# Patient Record
Sex: Male | Born: 1970 | Race: White | Hispanic: No | Marital: Married | Smoking: Never smoker
Health system: Southern US, Community
[De-identification: ages and names within clinical notes are randomized; demographics above are authoritative.]

## PROBLEM LIST (undated history)

## (undated) DIAGNOSIS — K219 Gastro-esophageal reflux disease without esophagitis: Secondary | ICD-10-CM

## (undated) HISTORY — PX: COLONOSCOPY: SHX174

## (undated) HISTORY — PX: SHOULDER SURGERY: SHX246

## (undated) HISTORY — PX: TONSILLECTOMY: SUR1361

## (undated) HISTORY — PX: UPPER GASTROINTESTINAL ENDOSCOPY: SHX188

## (undated) HISTORY — DX: Gastro-esophageal reflux disease without esophagitis: K21.9

---

## 2000-04-18 ENCOUNTER — Encounter: Payer: Self-pay | Admitting: Internal Medicine

## 2000-05-30 ENCOUNTER — Encounter: Payer: Self-pay | Admitting: Internal Medicine

## 2000-11-11 ENCOUNTER — Emergency Department (HOSPITAL_COMMUNITY): Admission: EM | Admit: 2000-11-11 | Discharge: 2000-11-11 | Payer: Self-pay | Admitting: Emergency Medicine

## 2000-11-11 ENCOUNTER — Encounter: Payer: Self-pay | Admitting: Emergency Medicine

## 2002-01-04 ENCOUNTER — Encounter: Payer: Self-pay | Admitting: Internal Medicine

## 2003-05-29 ENCOUNTER — Encounter: Payer: Self-pay | Admitting: Internal Medicine

## 2005-08-09 ENCOUNTER — Ambulatory Visit: Payer: Self-pay | Admitting: Internal Medicine

## 2006-10-25 ENCOUNTER — Ambulatory Visit: Payer: Self-pay | Admitting: Internal Medicine

## 2007-09-01 DIAGNOSIS — Z8719 Personal history of other diseases of the digestive system: Secondary | ICD-10-CM | POA: Insufficient documentation

## 2007-09-01 DIAGNOSIS — K219 Gastro-esophageal reflux disease without esophagitis: Secondary | ICD-10-CM | POA: Insufficient documentation

## 2008-03-17 ENCOUNTER — Ambulatory Visit: Payer: Self-pay | Admitting: Internal Medicine

## 2008-04-01 ENCOUNTER — Ambulatory Visit (HOSPITAL_COMMUNITY): Admission: RE | Admit: 2008-04-01 | Discharge: 2008-04-01 | Payer: Self-pay | Admitting: Orthopaedic Surgery

## 2008-04-30 ENCOUNTER — Telehealth: Payer: Self-pay | Admitting: Internal Medicine

## 2008-05-13 ENCOUNTER — Telehealth: Payer: Self-pay | Admitting: Internal Medicine

## 2010-02-17 ENCOUNTER — Telehealth: Payer: Self-pay | Admitting: Internal Medicine

## 2010-02-24 ENCOUNTER — Ambulatory Visit: Payer: Self-pay | Admitting: Internal Medicine

## 2010-02-24 DIAGNOSIS — F329 Major depressive disorder, single episode, unspecified: Secondary | ICD-10-CM | POA: Insufficient documentation

## 2010-06-08 NOTE — Progress Notes (Signed)
Summary: [facility]   Imported By: Sherian Rein 02/24/2010 13:32:04  _____________________________________________________________________  External Attachment:    Type:   Image     Comment:   External Document

## 2010-06-08 NOTE — Consult Note (Signed)
Summary: [facility]   Imported By: Sherian Rein 02/24/2010 13:25:49  _____________________________________________________________________  External Attachment:    Type:   Image     Comment:   External Document

## 2010-06-08 NOTE — Progress Notes (Signed)
Summary: [facility]   Imported By: Sherian Rein 02/24/2010 13:30:13  _____________________________________________________________________  External Attachment:    Type:   Image     Comment:   External Document

## 2010-06-08 NOTE — Progress Notes (Signed)
Summary: [facility]   Imported By: Sherian Rein 02/24/2010 13:28:09  _____________________________________________________________________  External Attachment:    Type:   Image     Comment:   External Document

## 2010-06-08 NOTE — Progress Notes (Signed)
Summary: RX  Medications Added PROTONIX 40 MG TBEC (PANTOPRAZOLE SODIUM) 1 tablet by mouth once daily       Phone Note Call from Patient Call back at Home Phone [phone]   Caller: Patient Call For: perry  Reason for Call: Talk to Nurse Details for Reason: RX Summary of Call: would like Protonix  [facility]  [address]  [facility] Please call pt when done Initial call taken by: Guadlupe [facility],  April 30, 2008 12:36 PM    New/Updated Medications: PROTONIX 40 MG TBEC (PANTOPRAZOLE SODIUM) 1 tablet by mouth once daily   Prescriptions: PROTONIX 40 MG TBEC (PANTOPRAZOLE SODIUM) 1 tablet by mouth once daily  #30 x 11   Entered by:   Milford Cage CMA   Authorized by:   Hilarie Fredrickson MD   Signed by:   Milford Cage CMA on 04/30/2008   Method used:   [facility]  [address]  [id number]       [address]       [place], [place]  [address]       Ph: [phone] or [phone]       Fax: [phone]   RxID:   [id number]

## 2010-06-08 NOTE — Assessment & Plan Note (Signed)
Summary: yearly follow up/yf    History of Present Illness Visit Type: follow up Primary GI MD: Yancey Flemings MD Primary Provider: Sigmund Hazel, MD Chief Complaint: Follow-up visit-yearly History of Present Illness:   Jared Deleon presents today for routine followup. He is seen in his office and you late regarding management of gastroesophageal reflux disease. Symptoms have been controlled on Nexium 40 mg daily. Omeprazole 20 mg daily is not effective. He has rare breakthrough symptoms. No dysphagia. No medication side effects. No interval medical problems. He tells me that his prescription benefit for the year as expired. His looking for a more affordable alternative until the first of the year.             Prior Medications Reviewed Using: List Brought by Patient  Updated Prior Medication List: NEXIUM 40 MG  CPDR (ESOMEPRAZOLE MAGNESIUM) 1 capsule by mouth once daily ADDERALL XR 20 MG XR24H-CAP (AMPHETAMINE-DEXTROAMPHETAMINE) 1 tablet by mouth once daily ALEVE 220 MG TABS (NAPROXEN SODIUM) 1 tablet by mouth two times a day as needed  Current Allergies (reviewed today): ! PENICILLIN  Past Medical History:    Reviewed history from 09/01/2007 and no changes required:       Current Problems:        IRRITABLE BOWEL SYNDROME, HX OF (ICD-V12.79)       GERD (ICD-530.81)         Past Surgical History:    Tonsillectomy    Shoulder Surgery   Family History:    No FH of Colon Cancer:    Family History of Prostate Cancer:Paternal Grandfather diagnosed age: mid 80's  Social History:    Patient has never smoked.     Alcohol Use - yes    Daily Caffeine Use    Illicit Drug Use - no    Patient gets regular exercise.   Risk Factors:  Tobacco use:  never Drug use:  no Alcohol use:  yes Exercise:  yes   Review of Systems       entirely negative   Vital Signs:  Patient Profile:   40 Years Old Male Height:     69 inches Weight:      164 pounds Pulse rate:   88 / minute Pulse  rhythm:   regular BP sitting:   120 / 74  (left arm)  Vitals Entered By: Teresita Madura CMA (March 17, 2008 1:36 PM)                  Physical Exam  General:     Well developed, well nourished, no acute distress. Head:     Normocephalic and atraumatic. Eyes:     PERRLA, no icterus. Nose:     No deformity, discharge,  or lesions. Mouth:     No deformity or lesions, dentition normal. Lungs:     Clear throughout to auscultation. Heart:     Regular rate and rhythm; no murmurs, rubs,  or bruits. Abdomen:     Soft, nontender and nondistended. No masses, hepatosplenomegaly or hernias noted. Normal bowel sounds. Msk:     Symmetrical with no gross deformities. Normal posture. Pulses:     Normal pulses noted. Extremities:     No clubbing, cyanosis, edema or deformities noted. Neurologic:     Alert and  oriented x4;  grossly normal neurologically. Skin:     Intact without significant lesions or rashes. Psych:     Alert and cooperative. Normal mood and affect.    Impression & Recommendations:  Problem #  1:  GERD (ICD-530.81) Asymptomatic on Nexium. Issues regarding medication affordability as discussed.  Plan: #1. Samples of Protonix 40 mg daily have been provided. If this is effective, then the generic pantoprazole can be prescribed. #2. samples of Nexium also provided #3. Routine office followup in one year. Sooner for problems or questions   Patient Instructions: 1)  Samples of Nexium and Protonix given to patient. 2)  He is to call the office and inform us of which one works best. 3)  Rx. will then be phoned to his pharmacy.    ]

## 2010-06-08 NOTE — Progress Notes (Signed)
Summary: Medication refill  Medications Added NEXIUM 40 MG  CPDR (ESOMEPRAZOLE MAGNESIUM) 1 capsule by mouth once daily ADDERALL XR 20 MG XR24H-CAP (AMPHETAMINE-DEXTROAMPHETAMINE) 1 tablet by mouth once daily ALEVE 220 MG TABS (NAPROXEN SODIUM) 1 tablet by mouth two times a day as needed PROTONIX 40 MG TBEC (PANTOPRAZOLE SODIUM) 1 tablet by mouth once daily       Phone Note Call from Patient Call back at [facility] Phone [phone]   Caller: Patient Call For: Dr. Marina Goodell Reason for Call: Refill Medication Summary of Call: Needs a refill on Nexium....his appt. was bumped w/Dr. Marina Goodell and he has ran out Initial call taken by: Karna Christmas,  February 17, 2010 2:08 PM  Follow-up for Phone Call        refilled for #30 NRFs.  pt. notified. L/M on Voicemail.    Milford Cage [facility]  February 17, 2010 2:16 PM     Prescriptions: NEXIUM 40 MG  CPDR (ESOMEPRAZOLE MAGNESIUM) 1 capsule by mouth once daily  #30 Capsule x 0   Entered by:   Milford Cage NCMA   Authorized by:   Hilarie Fredrickson MD   Signed by:   Milford Cage NCMA on 02/17/2010   Method used:   [facility]        [facility] [address] [facility]       [address]       [place], [place]  [address]       Ph: [phone] or [phone]       Fax: [phone]   RxID:   [id number]

## 2010-06-08 NOTE — Progress Notes (Signed)
Summary: rx refill    Phone Note Call from Patient Call back at Home Phone [phone]   Caller: Patient Call For: perry Reason for Call: Refill Medication, Talk to Nurse Summary of Call: pt would like an rx called in (nexium) protonix does not work please cal it in to [facility] [address] [phone] Initial call taken by: Tawni Levy,  May 13, 2008 1:08 PM      Prescriptions: NEXIUM 40 MG  CPDR (ESOMEPRAZOLE MAGNESIUM) 1 capsule by mouth once daily  #30 x 6   Entered by:   Milford Cage CMA   Authorized by:   Hilarie Fredrickson MD   Signed by:   Milford Cage CMA on 05/13/2008   Method used:   [facility]  [address]  [facility]       [address]       [place], [place]  [address]       Ph: [phone] or [phone]       Fax: [phone]   RxID:   [id number]

## 2010-06-08 NOTE — Progress Notes (Signed)
Summary: [facility]   Imported By: Sherian Rein 02/24/2010 13:31:17  _____________________________________________________________________  External Attachment:    Type:   Image     Comment:   External Document

## 2010-06-08 NOTE — Assessment & Plan Note (Signed)
Summary: GERD. Med refill and routine followup    History of Present Illness Visit Type: Follow-up Visit Primary GI MD: Yancey Flemings MD Primary Provider: Sigmund Hazel, MD Requesting Provider: na Chief Complaint: F/u for GERD. Pt denies any GI complaints. Need refill on Nexium  History of Present Illness:   40 year old with a history of GERD without alarm features. Being managed adequate. He takes Nexium 40 mg daily. No prior upper endoscopy. He continues to report good control of symptoms on Nexium. No breakthrough symptoms. No dysphagia. No appreciable medication side effects. Has routine annual medical care with Dr. Hyacinth Meeker. Requests medication refill.   GI Review of Systems      Denies abdominal pain, acid reflux, belching, bloating, chest pain, dysphagia with liquids, dysphagia with solids, heartburn, loss of appetite, nausea, vomiting, vomiting blood, weight loss, and  weight gain.        Denies anal fissure, black tarry stools, change in bowel habit, constipation, diarrhea, diverticulosis, fecal incontinence, heme positive stool, hemorrhoids, irritable bowel syndrome, jaundice, light color stool, liver problems, rectal bleeding, and  rectal pain.    Current Medications (verified): 1)  Nexium 40 Mg  Cpdr (Esomeprazole Magnesium) .Marland Kitchen.. 1 Capsule By Mouth Once Daily 2)  Aleve 220 Mg Tabs (Naproxen Sodium) .Marland Kitchen.. 1 Tablet By Mouth Two Times A Day As Needed 3)  Wellbutrin Xl 300 Mg Xr24h-Tab (Bupropion Hcl) .... One Tablet By Mouth Once Daily  Allergies (verified): 1)  ! Penicillin  Past History:  Past Medical History: DEPRESSION (ICD-311) IRRITABLE BOWEL SYNDROME, HX OF (ICD-V12.79) GERD (ICD-530.81)  Past Surgical History: Reviewed history from 03/17/2008 and no changes required. Tonsillectomy Shoulder Surgery  Family History: Reviewed history from 03/17/2008 and no changes required. No FH of Colon Cancer: Family History of Prostate Cancer:Paternal Grandfather diagnosed age:  mid 78's  Social History: [employer]  Married Patient has never smoked.  Alcohol Use - yes Daily Caffeine Use Illicit Drug Use - no Patient gets regular exercise.  Review of Systems  The patient denies allergy/sinus, anemia, anxiety-new, arthritis/joint pain, back pain, blood in urine, breast changes/lumps, change in vision, confusion, cough, coughing up blood, depression-new, fainting, fatigue, fever, headaches-new, hearing problems, heart murmur, heart rhythm changes, itching, menstrual pain, muscle pains/cramps, night sweats, nosebleeds, pregnancy symptoms, shortness of breath, skin rash, sleeping problems, sore throat, swelling of feet/legs, swollen lymph glands, thirst - excessive , urination - excessive , urination changes/pain, urine leakage, vision changes, and voice change.    Vital Signs:  Patient profile:   40 year old male Height:      69 inches Weight:      172 pounds BMI:     25.49 BSA:     1.94 Pulse rate:   88 / minute Pulse rhythm:   regular BP sitting:   120 / 80  (left arm) Cuff size:   regular  Vitals Entered By: Ok Anis CMA (February 24, 2010 8:59 AM)  Physical Exam  General:  Well developed, well nourished, no acute distress. Head:  Normocephalic and atraumatic. Eyes:  PERRLA, no icterus. Ears:  Normal auditory acuity. Nose:  No deformity, discharge,  or lesions. Mouth:  No deformity or lesions, dentition normal. Neck:  Supple; no masses or thyromegaly. Lungs:  Clear throughout to auscultation. Heart:  Regular rate and rhythm; no murmurs, rubs,  or bruits. Abdomen:  Soft, nontender and nondistended. No masses, hepatosplenomegaly or hernias noted. Normal bowel sounds. Msk:  Symmetrical with no gross deformities. Normal posture. Pulses:  Normal pulses noted. Extremities:  no edema Neurologic:  alert and oriented Skin:  no jaundice Psych:  Alert and cooperative. Normal mood and affect.   Impression & Recommendations:  Problem # 1:  GERD  (ICD-530.81) chronic GERD. Asymptomatic on Nexium. No interval problems. We discussed the current state of GERD therapies.  Plan #1. Reflux precautions #2. Refill Nexium 40 mg daily #3. Routine followup in the office in 2 years. Sooner for interval questions or problems  Patient Instructions: 1)  Refilled Nexium 40 mg #30 x 11 RFs. 2)  Follow-up in 2 years. 3)  Copy sent to : Sigmund Hazel, MD 4)  The medication list was reviewed and reconciled.  All changed / newly prescribed medications were explained.  A complete medication list was provided to the patient / caregiver. Prescriptions: NEXIUM 40 MG  CPDR (ESOMEPRAZOLE MAGNESIUM) 1 capsule by mouth once daily  #30 Capsule x 11   Entered by:   Milford Cage NCMA   Authorized by:   Hilarie Fredrickson MD   Signed by:   Milford Cage NCMA on 02/24/2010   Method used:   [facility] to        [facility] [address] [facility]       [address]       [place], [place]  [address]       Ph: [phone] or [phone]       Fax: [phone]   RxID:   [id number]

## 2010-07-21 IMAGING — RF DG FLUORO GUIDE NDL PLC/BX
4 series · 4 of 4 positions shown · IV contrast (multihance)
Comparison: none

CLINICAL DATA: Shoulder pain.  History of football injury.

Right SHOULDER INJECTION UNDER FLUOROSCOPY
The procedure, risks, benefits, and alternatives were explained to
the patient. Questions regarding the procedure were encouraged and
answered. Written consent was obtained.
TECHNIQUE: An appropriate skin entrance site was determined under
fluoroscopy. The site was marked, prepped with Betadine, draped in
the usual sterile fashion. Lidocaine was used for local anesthesia.
Subsequently, a 20 gauge spinal needle was advanced into the
glenohumeral joint under intermittent fluoroscopy.   Approximately
12 cc of a mixture of 0.2 ml Multihance  in 10 ml Ivette 60 and 10
cc lidocaine was then used to opacify the right shoulder capsule.
No immediate complication.

[Series 1: run · 1 of 1 slices shown (1 of 4)]
[im 1/1]
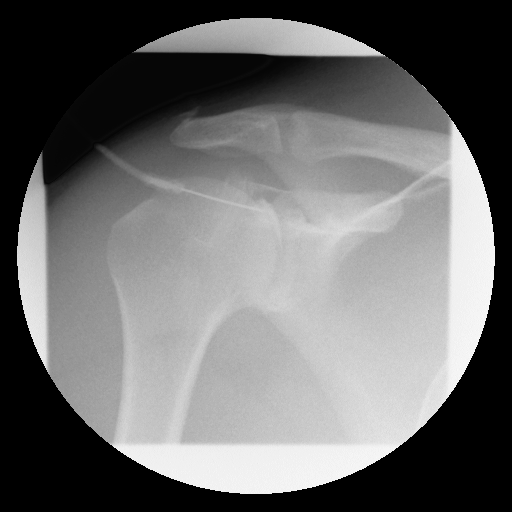

[Series 2: run · 1 of 1 slices shown (2 of 4)]
[im 1/1]
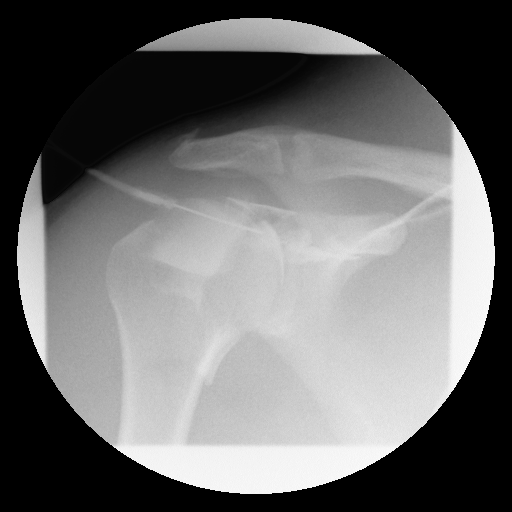

[Series 3: run · 1 of 1 slices shown (3 of 4)]
[im 1/1]
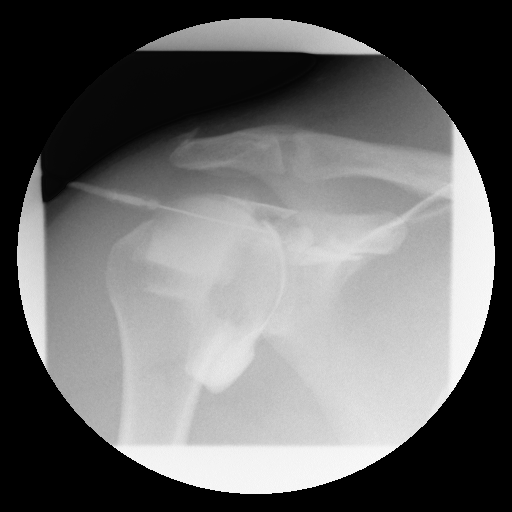

[Series 4: run · 1 of 1 slices shown (4 of 4)]
[im 1/1]
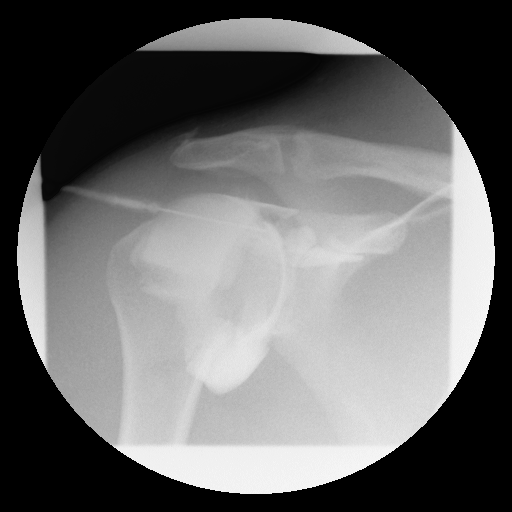

[4 of 4 positions shown; findings below may reference images not displayed]

IMPRESSION: Technically successful right shoulder injection for MRI.

## 2010-09-21 NOTE — Assessment & Plan Note (Signed)
[facility]                         GASTROENTEROLOGY OFFICE NOTE   Jared Deleon                        MRN:          [id number]  DATE:10/25/2006                            DOB:          [birth year]    HISTORY:  Jared Deleon presents today for followup regarding management of his  chronic gastroesophageal reflux disease.  He is a 40 year old who was  evaluated last August 09, 2005,  regarding the same.  At that time he was  doing well on 40 mg of Nexium daily.  He has continued on Nexium without  problems.  On the medication he has good control of symptoms with no  significant heartburn.  No dysphagia.  He has had no significant  interval medical problems.  He did develop transient gastroenteritis a  few weeks while visiting in [place].   MEDICATIONS:  His only medication is Nexium 40 mg daily.   PHYSICAL EXAMINATION:  GENERAL:  Well-appearing male in no acute  distress.  VITAL SIGNS:  Blood pressure 120/72.  Heart rate 74.  Weight 166 pounds.  HEENT:  Sclerae anicteric.  LUNGS:  Clear.  HEART:  Regular.  ABDOMEN:  Soft without tenderness, mass, or hernia.   IMPRESSION:  Gastroesophageal reflux disease, symptoms controlled on  Nexium.   RECOMMENDATIONS:  1. Continue Nexium 40 mg daily.  A prescription with multiple refills      has been provided.  2. Continue reflux precautions.  3. GI follow up in one year unless interval questions or problems.     Jared Deleon. Marina Goodell, MD  Electronically Signed    JNP/MedQ  DD: 10/25/2006  DT: 10/25/2006  Job #: [id number]

## 2011-02-27 ENCOUNTER — Other Ambulatory Visit: Payer: Self-pay | Admitting: Internal Medicine

## 2011-03-03 ENCOUNTER — Other Ambulatory Visit: Payer: Self-pay | Admitting: Internal Medicine

## 2011-03-09 MED ORDER — ESOMEPRAZOLE MAGNESIUM 40 MG PO CPDR: 40.0000 mg | DELAYED_RELEASE_CAPSULE | Freq: Every day | ORAL | Status: DC

## 2011-03-09 NOTE — Telephone Encounter (Signed)
Sent patient one refill and instructed them to make office visit for further refills

## 2011-04-08 ENCOUNTER — Other Ambulatory Visit: Payer: Self-pay | Admitting: Internal Medicine

## 2012-04-04 ENCOUNTER — Other Ambulatory Visit: Payer: Self-pay | Admitting: Internal Medicine

## 2012-09-06 ENCOUNTER — Encounter: Payer: Self-pay | Admitting: Internal Medicine

## 2012-09-06 ENCOUNTER — Ambulatory Visit (INDEPENDENT_AMBULATORY_CARE_PROVIDER_SITE_OTHER): Payer: BC Managed Care – PPO | Admitting: Internal Medicine

## 2012-09-06 VITALS — BP 104/76 | HR 80 | Ht 69.0 in | Wt 167.8 lb

## 2012-09-06 DIAGNOSIS — K219 Gastro-esophageal reflux disease without esophagitis: Secondary | ICD-10-CM

## 2012-09-06 MED ORDER — ESOMEPRAZOLE MAGNESIUM 40 MG PO CPDR: 40.0000 mg | DELAYED_RELEASE_CAPSULE | Freq: Every day | ORAL | Status: DC

## 2012-09-06 NOTE — Patient Instructions (Addendum)
We have sent the following medications to your pharmacy for you to pick up at your convenience:  Nexium  Please follow up with Dr. Perry in 2 years.  

## 2012-09-06 NOTE — Progress Notes (Signed)
HISTORY OF PRESENT ILLNESS:  Jared Deleon is a 42 y.o. male with chronic GERD without alarm features. For this he has been on Nexium 40 mg daily. He was last seen in the office October 2011. At that time he was doing well on his medication. He continues to do well without active reflux symptoms. No dysphagia. No. No appreciable medication side effects. His GI review of systems is otherwise negative. No interval change in his family history or medical history.  REVIEW OF SYSTEMS:  All non-GI ROS negative entirely  Past Medical History  Diagnosis Date  . GERD (gastroesophageal reflux disease)     Past Surgical History  Procedure Laterality Date  . Tonsillectomy    . Shoulder surgery      Social History MCKAY TEGTMEYER  reports that he has never smoked. He has never used smokeless tobacco. He reports that  drinks alcohol. He reports that he does not use illicit drugs.  family history includes Prostate cancer in his paternal grandfather.  Allergies  Allergen Reactions  . Penicillins        PHYSICAL EXAMINATION: Vital signs: BP 104/76  Pulse 80  Ht 5\' 9"  (1.753 m)  Wt 167 lb 12.8 oz (76.114 kg)  BMI 24.77 kg/m2 General: Well-developed, well-nourished, no acute distress HEENT: Sclerae are anicteric, conjunctiva pink. Oral mucosa intact Lungs: Clear Heart: Regular Abdomen: soft, nontender, nondistended, no obvious ascites, no peritoneal signs, normal bowel sounds. No organomegaly. Extremities: No edema Psychiatric: alert and oriented x3. Cooperative   ASSESSMENT:  #1. GERD without alarm features. Continues to be symptom free on PPI. Discussed her knowledge on possible PPI-related effects that have been reported such as bone density issues and low magnesium. He is aware  PLAN:  #1. Continue reflux precautions #2. Continue PPI therapy. Lowest dose to control symptoms. Nexium prescription refilled #3. Routine GI followup in 2 years. Contact the office in the interim for any  questions or problems.

## 2013-09-02 ENCOUNTER — Other Ambulatory Visit: Payer: Self-pay | Admitting: Internal Medicine

## 2014-03-28 ENCOUNTER — Other Ambulatory Visit: Payer: Self-pay | Admitting: Internal Medicine

## 2015-01-28 ENCOUNTER — Other Ambulatory Visit: Payer: Self-pay | Admitting: Internal Medicine

## 2015-04-20 ENCOUNTER — Encounter: Payer: Self-pay | Admitting: Internal Medicine

## 2015-04-20 ENCOUNTER — Ambulatory Visit (INDEPENDENT_AMBULATORY_CARE_PROVIDER_SITE_OTHER): Payer: BLUE CROSS/BLUE SHIELD | Admitting: Internal Medicine

## 2015-04-20 VITALS — BP 100/68 | HR 76 | Ht 68.5 in | Wt 177.0 lb

## 2015-04-20 DIAGNOSIS — K219 Gastro-esophageal reflux disease without esophagitis: Secondary | ICD-10-CM

## 2015-04-20 MED ORDER — ESOMEPRAZOLE MAGNESIUM 40 MG PO CPDR: DELAYED_RELEASE_CAPSULE | ORAL | Status: DC

## 2015-04-20 NOTE — Patient Instructions (Signed)
We have sent the following medications to your pharmacy for you to pick up at your convenience: Nexium  Follow up with Dr. [name] in 2 years.

## 2015-04-20 NOTE — Progress Notes (Signed)
HISTORY OF PRESENT ILLNESS:  Jared Deleon is a 44 y.o. male with chronic GERD without alarm features. He was last evaluated 09/06/2012. At that time he was asymptomatic on Nexium 40 mg daily. Continued reflux precautions and follow-up in 2 years advised. He tells me that approximately 3 months ago his prescription was changed by the pharmacy to a generic equivalent (Esomeprazole 40 mg). He does notice with this formulation occasional breakthrough symptoms. He has had 10 pound weight gain since his last visit per our scale. He continues to deny dysphagia or other issues. No appreciable medication side effects.  REVIEW OF SYSTEMS:  All non-GI ROS negative except for back pain  Past Medical History  Diagnosis Date  . GERD (gastroesophageal reflux disease)     Past Surgical History  Procedure Laterality Date  . Tonsillectomy    . Shoulder surgery      Social History Jared Deleon  reports that he has never smoked. He has never used smokeless tobacco. He reports that he drinks alcohol. He reports that he does not use illicit drugs.  family history includes Prostate cancer in his paternal grandfather.  Allergies  Allergen Reactions  . Penicillins        PHYSICAL EXAMINATION: Vital signs: BP 100/68 mmHg  Pulse 76  Ht 5' 8.5" (1.74 m)  Wt 177 lb (80.287 kg)  BMI 26.52 kg/m2 General: Well-developed, well-nourished, no acute distress HEENT: Sclerae are anicteric, conjunctiva pink. Oral mucosa intact Lungs: Clear Heart: Regular Abdomen: soft, nontender, nondistended, no obvious ascites, no peritoneal signs, normal bowel sounds. No organomegaly. Extremities: No clubbing cyanosis or edema Psychiatric: alert and oriented x3. Cooperative   ASSESSMENT:  #1. Chronic GERD without alarm features. He continues on PPI therapy as described. Occasional breakthrough symptoms recently. Possibly due to change in formulation, tachycardia for access, or modest weight gain   PLAN:  #1.  Reflux precautions #2. Continue PPI. Refilled. Repeat discussion today reviewing available information regarding potential concerns over long-term PPI use. Questions answered #3. Routine follow-up 2 years. Sooner if needed  15 minutes was spent face-to-face with the patient. Greater than 50% the time use for counseling regarding GERD and its treatment

## 2015-04-28 ENCOUNTER — Other Ambulatory Visit: Payer: Self-pay | Admitting: Internal Medicine

## 2015-09-03 DIAGNOSIS — Z23 Encounter for immunization: Secondary | ICD-10-CM | POA: Diagnosis not present

## 2015-09-03 DIAGNOSIS — Z0000 Encounter for general adult medical examination without abnormal findings: Secondary | ICD-10-CM | POA: Diagnosis not present

## 2015-09-03 DIAGNOSIS — K219 Gastro-esophageal reflux disease without esophagitis: Secondary | ICD-10-CM | POA: Diagnosis not present

## 2015-09-23 DIAGNOSIS — H5203 Hypermetropia, bilateral: Secondary | ICD-10-CM | POA: Diagnosis not present

## 2015-11-05 DIAGNOSIS — M545 Low back pain: Secondary | ICD-10-CM | POA: Diagnosis not present

## 2015-11-05 DIAGNOSIS — M9903 Segmental and somatic dysfunction of lumbar region: Secondary | ICD-10-CM | POA: Diagnosis not present

## 2015-11-05 DIAGNOSIS — M542 Cervicalgia: Secondary | ICD-10-CM | POA: Diagnosis not present

## 2015-11-05 DIAGNOSIS — M9901 Segmental and somatic dysfunction of cervical region: Secondary | ICD-10-CM | POA: Diagnosis not present

## 2016-01-07 DIAGNOSIS — M9903 Segmental and somatic dysfunction of lumbar region: Secondary | ICD-10-CM | POA: Diagnosis not present

## 2016-01-07 DIAGNOSIS — M9901 Segmental and somatic dysfunction of cervical region: Secondary | ICD-10-CM | POA: Diagnosis not present

## 2016-01-07 DIAGNOSIS — M542 Cervicalgia: Secondary | ICD-10-CM | POA: Diagnosis not present

## 2016-01-07 DIAGNOSIS — M545 Low back pain: Secondary | ICD-10-CM | POA: Diagnosis not present

## 2016-01-26 DIAGNOSIS — M9903 Segmental and somatic dysfunction of lumbar region: Secondary | ICD-10-CM | POA: Diagnosis not present

## 2016-01-26 DIAGNOSIS — M542 Cervicalgia: Secondary | ICD-10-CM | POA: Diagnosis not present

## 2016-01-26 DIAGNOSIS — M545 Low back pain: Secondary | ICD-10-CM | POA: Diagnosis not present

## 2016-01-26 DIAGNOSIS — M9901 Segmental and somatic dysfunction of cervical region: Secondary | ICD-10-CM | POA: Diagnosis not present

## 2016-02-01 DIAGNOSIS — M9901 Segmental and somatic dysfunction of cervical region: Secondary | ICD-10-CM | POA: Diagnosis not present

## 2016-02-01 DIAGNOSIS — M545 Low back pain: Secondary | ICD-10-CM | POA: Diagnosis not present

## 2016-02-01 DIAGNOSIS — M542 Cervicalgia: Secondary | ICD-10-CM | POA: Diagnosis not present

## 2016-02-01 DIAGNOSIS — M9903 Segmental and somatic dysfunction of lumbar region: Secondary | ICD-10-CM | POA: Diagnosis not present

## 2016-02-18 DIAGNOSIS — M9901 Segmental and somatic dysfunction of cervical region: Secondary | ICD-10-CM | POA: Diagnosis not present

## 2016-02-18 DIAGNOSIS — M545 Low back pain: Secondary | ICD-10-CM | POA: Diagnosis not present

## 2016-02-18 DIAGNOSIS — M9903 Segmental and somatic dysfunction of lumbar region: Secondary | ICD-10-CM | POA: Diagnosis not present

## 2016-02-18 DIAGNOSIS — M542 Cervicalgia: Secondary | ICD-10-CM | POA: Diagnosis not present

## 2016-04-21 DIAGNOSIS — M542 Cervicalgia: Secondary | ICD-10-CM | POA: Diagnosis not present

## 2016-04-21 DIAGNOSIS — M9903 Segmental and somatic dysfunction of lumbar region: Secondary | ICD-10-CM | POA: Diagnosis not present

## 2016-04-21 DIAGNOSIS — M545 Low back pain: Secondary | ICD-10-CM | POA: Diagnosis not present

## 2016-04-21 DIAGNOSIS — M9901 Segmental and somatic dysfunction of cervical region: Secondary | ICD-10-CM | POA: Diagnosis not present

## 2016-04-22 DIAGNOSIS — K0825 Moderate atrophy of the maxilla: Secondary | ICD-10-CM | POA: Diagnosis not present

## 2016-04-26 DIAGNOSIS — M9903 Segmental and somatic dysfunction of lumbar region: Secondary | ICD-10-CM | POA: Diagnosis not present

## 2016-04-26 DIAGNOSIS — M542 Cervicalgia: Secondary | ICD-10-CM | POA: Diagnosis not present

## 2016-04-26 DIAGNOSIS — M9901 Segmental and somatic dysfunction of cervical region: Secondary | ICD-10-CM | POA: Diagnosis not present

## 2016-04-26 DIAGNOSIS — M545 Low back pain: Secondary | ICD-10-CM | POA: Diagnosis not present

## 2016-05-20 ENCOUNTER — Other Ambulatory Visit: Payer: Self-pay | Admitting: Internal Medicine

## 2016-08-25 DIAGNOSIS — M9903 Segmental and somatic dysfunction of lumbar region: Secondary | ICD-10-CM | POA: Diagnosis not present

## 2016-08-25 DIAGNOSIS — M9901 Segmental and somatic dysfunction of cervical region: Secondary | ICD-10-CM | POA: Diagnosis not present

## 2016-08-25 DIAGNOSIS — M542 Cervicalgia: Secondary | ICD-10-CM | POA: Diagnosis not present

## 2016-08-25 DIAGNOSIS — M545 Low back pain: Secondary | ICD-10-CM | POA: Diagnosis not present

## 2016-08-29 DIAGNOSIS — M542 Cervicalgia: Secondary | ICD-10-CM | POA: Diagnosis not present

## 2016-08-29 DIAGNOSIS — M545 Low back pain: Secondary | ICD-10-CM | POA: Diagnosis not present

## 2016-08-29 DIAGNOSIS — M9903 Segmental and somatic dysfunction of lumbar region: Secondary | ICD-10-CM | POA: Diagnosis not present

## 2016-08-29 DIAGNOSIS — M9901 Segmental and somatic dysfunction of cervical region: Secondary | ICD-10-CM | POA: Diagnosis not present

## 2016-09-08 DIAGNOSIS — M62830 Muscle spasm of back: Secondary | ICD-10-CM | POA: Diagnosis not present

## 2016-09-08 DIAGNOSIS — M542 Cervicalgia: Secondary | ICD-10-CM | POA: Diagnosis not present

## 2016-09-08 DIAGNOSIS — M9901 Segmental and somatic dysfunction of cervical region: Secondary | ICD-10-CM | POA: Diagnosis not present

## 2016-09-08 DIAGNOSIS — M9902 Segmental and somatic dysfunction of thoracic region: Secondary | ICD-10-CM | POA: Diagnosis not present

## 2016-09-12 DIAGNOSIS — M9902 Segmental and somatic dysfunction of thoracic region: Secondary | ICD-10-CM | POA: Diagnosis not present

## 2016-09-12 DIAGNOSIS — M62830 Muscle spasm of back: Secondary | ICD-10-CM | POA: Diagnosis not present

## 2016-09-12 DIAGNOSIS — M9901 Segmental and somatic dysfunction of cervical region: Secondary | ICD-10-CM | POA: Diagnosis not present

## 2016-09-12 DIAGNOSIS — M542 Cervicalgia: Secondary | ICD-10-CM | POA: Diagnosis not present

## 2016-09-16 DIAGNOSIS — Z13220 Encounter for screening for lipoid disorders: Secondary | ICD-10-CM | POA: Diagnosis not present

## 2016-09-16 DIAGNOSIS — N529 Male erectile dysfunction, unspecified: Secondary | ICD-10-CM | POA: Diagnosis not present

## 2016-09-16 DIAGNOSIS — Z0000 Encounter for general adult medical examination without abnormal findings: Secondary | ICD-10-CM | POA: Diagnosis not present

## 2016-09-16 DIAGNOSIS — Z131 Encounter for screening for diabetes mellitus: Secondary | ICD-10-CM | POA: Diagnosis not present

## 2016-10-11 DIAGNOSIS — M542 Cervicalgia: Secondary | ICD-10-CM | POA: Diagnosis not present

## 2016-10-11 DIAGNOSIS — M9901 Segmental and somatic dysfunction of cervical region: Secondary | ICD-10-CM | POA: Diagnosis not present

## 2016-10-11 DIAGNOSIS — M62830 Muscle spasm of back: Secondary | ICD-10-CM | POA: Diagnosis not present

## 2016-10-11 DIAGNOSIS — M9902 Segmental and somatic dysfunction of thoracic region: Secondary | ICD-10-CM | POA: Diagnosis not present

## 2016-10-20 DIAGNOSIS — M9902 Segmental and somatic dysfunction of thoracic region: Secondary | ICD-10-CM | POA: Diagnosis not present

## 2016-10-20 DIAGNOSIS — M62830 Muscle spasm of back: Secondary | ICD-10-CM | POA: Diagnosis not present

## 2016-10-20 DIAGNOSIS — M542 Cervicalgia: Secondary | ICD-10-CM | POA: Diagnosis not present

## 2016-10-20 DIAGNOSIS — M9901 Segmental and somatic dysfunction of cervical region: Secondary | ICD-10-CM | POA: Diagnosis not present

## 2016-10-27 DIAGNOSIS — M542 Cervicalgia: Secondary | ICD-10-CM | POA: Diagnosis not present

## 2016-10-27 DIAGNOSIS — M62830 Muscle spasm of back: Secondary | ICD-10-CM | POA: Diagnosis not present

## 2016-10-27 DIAGNOSIS — M9901 Segmental and somatic dysfunction of cervical region: Secondary | ICD-10-CM | POA: Diagnosis not present

## 2016-10-27 DIAGNOSIS — M9902 Segmental and somatic dysfunction of thoracic region: Secondary | ICD-10-CM | POA: Diagnosis not present

## 2016-11-17 DIAGNOSIS — N5201 Erectile dysfunction due to arterial insufficiency: Secondary | ICD-10-CM | POA: Diagnosis not present

## 2016-11-17 DIAGNOSIS — E291 Testicular hypofunction: Secondary | ICD-10-CM | POA: Diagnosis not present

## 2016-11-30 DIAGNOSIS — Z125 Encounter for screening for malignant neoplasm of prostate: Secondary | ICD-10-CM | POA: Diagnosis not present

## 2016-11-30 DIAGNOSIS — E291 Testicular hypofunction: Secondary | ICD-10-CM | POA: Diagnosis not present

## 2016-12-07 DIAGNOSIS — M542 Cervicalgia: Secondary | ICD-10-CM | POA: Diagnosis not present

## 2016-12-07 DIAGNOSIS — M62830 Muscle spasm of back: Secondary | ICD-10-CM | POA: Diagnosis not present

## 2016-12-07 DIAGNOSIS — M9901 Segmental and somatic dysfunction of cervical region: Secondary | ICD-10-CM | POA: Diagnosis not present

## 2016-12-07 DIAGNOSIS — M9902 Segmental and somatic dysfunction of thoracic region: Secondary | ICD-10-CM | POA: Diagnosis not present

## 2017-02-21 DIAGNOSIS — M9901 Segmental and somatic dysfunction of cervical region: Secondary | ICD-10-CM | POA: Diagnosis not present

## 2017-02-21 DIAGNOSIS — M542 Cervicalgia: Secondary | ICD-10-CM | POA: Diagnosis not present

## 2017-02-21 DIAGNOSIS — M9902 Segmental and somatic dysfunction of thoracic region: Secondary | ICD-10-CM | POA: Diagnosis not present

## 2017-02-21 DIAGNOSIS — M62830 Muscle spasm of back: Secondary | ICD-10-CM | POA: Diagnosis not present

## 2017-04-26 ENCOUNTER — Other Ambulatory Visit: Payer: Self-pay | Admitting: Internal Medicine

## 2017-05-04 DIAGNOSIS — M62830 Muscle spasm of back: Secondary | ICD-10-CM | POA: Diagnosis not present

## 2017-05-04 DIAGNOSIS — M9901 Segmental and somatic dysfunction of cervical region: Secondary | ICD-10-CM | POA: Diagnosis not present

## 2017-05-04 DIAGNOSIS — M9902 Segmental and somatic dysfunction of thoracic region: Secondary | ICD-10-CM | POA: Diagnosis not present

## 2017-05-04 DIAGNOSIS — M542 Cervicalgia: Secondary | ICD-10-CM | POA: Diagnosis not present

## 2017-05-12 ENCOUNTER — Encounter: Payer: Self-pay | Admitting: Internal Medicine

## 2017-06-06 DIAGNOSIS — M9901 Segmental and somatic dysfunction of cervical region: Secondary | ICD-10-CM | POA: Diagnosis not present

## 2017-06-06 DIAGNOSIS — M9902 Segmental and somatic dysfunction of thoracic region: Secondary | ICD-10-CM | POA: Diagnosis not present

## 2017-06-06 DIAGNOSIS — M542 Cervicalgia: Secondary | ICD-10-CM | POA: Diagnosis not present

## 2017-07-18 ENCOUNTER — Other Ambulatory Visit: Payer: Self-pay | Admitting: Internal Medicine

## 2017-07-20 ENCOUNTER — Telehealth: Payer: Self-pay | Admitting: Internal Medicine

## 2017-07-21 MED ORDER — ESOMEPRAZOLE MAGNESIUM 40 MG PO CPDR: DELAYED_RELEASE_CAPSULE | ORAL | 3 refills | Status: DC

## 2017-07-21 NOTE — Telephone Encounter (Signed)
Refilled Esomeprazole 

## 2017-09-05 DIAGNOSIS — M9902 Segmental and somatic dysfunction of thoracic region: Secondary | ICD-10-CM | POA: Diagnosis not present

## 2017-09-05 DIAGNOSIS — M542 Cervicalgia: Secondary | ICD-10-CM | POA: Diagnosis not present

## 2017-09-05 DIAGNOSIS — M62830 Muscle spasm of back: Secondary | ICD-10-CM | POA: Diagnosis not present

## 2017-09-05 DIAGNOSIS — M9901 Segmental and somatic dysfunction of cervical region: Secondary | ICD-10-CM | POA: Diagnosis not present

## 2017-09-06 ENCOUNTER — Ambulatory Visit: Payer: BLUE CROSS/BLUE SHIELD | Admitting: Internal Medicine

## 2017-09-06 ENCOUNTER — Encounter: Payer: Self-pay | Admitting: Internal Medicine

## 2017-09-06 VITALS — BP 118/78 | HR 82 | Ht 68.0 in | Wt 176.5 lb

## 2017-09-06 DIAGNOSIS — K219 Gastro-esophageal reflux disease without esophagitis: Secondary | ICD-10-CM | POA: Diagnosis not present

## 2017-09-06 DIAGNOSIS — A09 Infectious gastroenteritis and colitis, unspecified: Secondary | ICD-10-CM

## 2017-09-06 MED ORDER — CIPROFLOXACIN HCL 500 MG PO TABS: 500.0000 mg | ORAL_TABLET | Freq: Two times a day (BID) | ORAL | 0 refills | Status: DC

## 2017-09-06 MED ORDER — ESOMEPRAZOLE MAGNESIUM 40 MG PO CPDR: DELAYED_RELEASE_CAPSULE | ORAL | 11 refills | Status: DC

## 2017-09-06 NOTE — Progress Notes (Signed)
HISTORY OF PRESENT ILLNESS:  Jared Deleon is a 47 y.o. male with chronic GERD without alarm features who has been followed in this office for the same. He presents today for follow-up regarding management of his chronic GERD and inquires about management strategies for potential traveler's diarrhea. The patient was last evaluated 04/20/2015. He continues to be maintained on Nexium 40 mg daily. On medication he might experience breakthrough symptoms approximately once per month for which he takes Tums. Otherwise he does well. No swallowing difficulties. No interval medical problems, hospitalizations, or surgeries. No other medications. He is going to [place] in the near future. He tells me that he has had problems with traveler's diarrhea in the past. Wonders about potential strategy should he develop the same. GI review of systems is otherwise negative  REVIEW OF SYSTEMS:  All non-GI ROS negative entirely  Past Medical History:  Diagnosis Date  . GERD (gastroesophageal reflux disease)     Past Surgical History:  Procedure Laterality Date  . SHOULDER SURGERY    . TONSILLECTOMY      Social History Jared Deleon  reports that he has never smoked. He has never used smokeless tobacco. He reports that he drinks alcohol. He reports that he does not use drugs.  family history includes Prostate cancer in his paternal grandfather.  Allergies  Allergen Reactions  . Penicillins        PHYSICAL EXAMINATION: Vital signs: BP 118/78   Pulse 82   Ht 5\' 8"  (1.727 m)   Wt 176 lb 8 oz (80.1 kg)   BMI 26.84 kg/m   Constitutional: generally well-appearing, no acute distress Psychiatric: alert and oriented x3, cooperative Eyes: extraocular movements intact, anicteric, conjunctiva pink Mouth: oral pharynx moist, no lesions Neck: supple no lymphadenopathy Cardiovascular: heart regular rate and rhythm, no murmur Lungs: clear to auscultation bilaterally Abdomen: soft, nontender, nondistended, no  obvious ascites, no peritoneal signs, normal bowel sounds, no organomegaly Rectal:omitted Extremities: no clubbing, cyanosis, or lower extremity edema bilaterally Skin: no lesions on visible extremities Neuro: No focal deficits. ranial nerves intact  ASSESSMENT:  #1. GERD without alarm features. Symptoms well controlled with Nexium 40 mg daily #2. Prophylaxis for traveler's diarrhea   PLAN:  #1. Reflux precautions #2. Continue Nexium 40 mg daily. Prescription refilled #3. Routine office follow-up 2 years #4. Imodium and Pepto-Bismol for potential traveler's diarrhea. Use as needed.  #5.Also, prescribed Cipro 500 mg twice daily for 3 days. Issues regarding potential photosensitivity raised and discussed

## 2017-09-06 NOTE — Patient Instructions (Signed)
We have sent the following medications to your pharmacy for you to pick up at your convenience:  Nexium, Cipro  Please follow up in 2 years

## 2017-10-11 DIAGNOSIS — Z79899 Other long term (current) drug therapy: Secondary | ICD-10-CM | POA: Diagnosis not present

## 2017-10-11 DIAGNOSIS — Z0000 Encounter for general adult medical examination without abnormal findings: Secondary | ICD-10-CM | POA: Diagnosis not present

## 2017-10-11 DIAGNOSIS — Z13220 Encounter for screening for lipoid disorders: Secondary | ICD-10-CM | POA: Diagnosis not present

## 2017-10-19 DIAGNOSIS — M62830 Muscle spasm of back: Secondary | ICD-10-CM | POA: Diagnosis not present

## 2017-10-19 DIAGNOSIS — M9901 Segmental and somatic dysfunction of cervical region: Secondary | ICD-10-CM | POA: Diagnosis not present

## 2017-10-19 DIAGNOSIS — M542 Cervicalgia: Secondary | ICD-10-CM | POA: Diagnosis not present

## 2017-10-19 DIAGNOSIS — M9902 Segmental and somatic dysfunction of thoracic region: Secondary | ICD-10-CM | POA: Diagnosis not present

## 2017-11-02 DIAGNOSIS — M9902 Segmental and somatic dysfunction of thoracic region: Secondary | ICD-10-CM | POA: Diagnosis not present

## 2017-11-02 DIAGNOSIS — M542 Cervicalgia: Secondary | ICD-10-CM | POA: Diagnosis not present

## 2017-11-02 DIAGNOSIS — M62830 Muscle spasm of back: Secondary | ICD-10-CM | POA: Diagnosis not present

## 2017-11-08 DIAGNOSIS — M9902 Segmental and somatic dysfunction of thoracic region: Secondary | ICD-10-CM | POA: Diagnosis not present

## 2017-11-08 DIAGNOSIS — M542 Cervicalgia: Secondary | ICD-10-CM | POA: Diagnosis not present

## 2017-11-08 DIAGNOSIS — M62830 Muscle spasm of back: Secondary | ICD-10-CM | POA: Diagnosis not present

## 2017-11-15 DIAGNOSIS — L237 Allergic contact dermatitis due to plants, except food: Secondary | ICD-10-CM | POA: Diagnosis not present

## 2017-12-06 DIAGNOSIS — M9902 Segmental and somatic dysfunction of thoracic region: Secondary | ICD-10-CM | POA: Diagnosis not present

## 2017-12-06 DIAGNOSIS — M9901 Segmental and somatic dysfunction of cervical region: Secondary | ICD-10-CM | POA: Diagnosis not present

## 2017-12-06 DIAGNOSIS — M542 Cervicalgia: Secondary | ICD-10-CM | POA: Diagnosis not present

## 2017-12-12 DIAGNOSIS — L820 Inflamed seborrheic keratosis: Secondary | ICD-10-CM | POA: Diagnosis not present

## 2018-01-24 DIAGNOSIS — M62830 Muscle spasm of back: Secondary | ICD-10-CM | POA: Diagnosis not present

## 2018-01-24 DIAGNOSIS — M9901 Segmental and somatic dysfunction of cervical region: Secondary | ICD-10-CM | POA: Diagnosis not present

## 2018-01-24 DIAGNOSIS — M542 Cervicalgia: Secondary | ICD-10-CM | POA: Diagnosis not present

## 2018-01-24 DIAGNOSIS — M9902 Segmental and somatic dysfunction of thoracic region: Secondary | ICD-10-CM | POA: Diagnosis not present

## 2018-03-26 DIAGNOSIS — M542 Cervicalgia: Secondary | ICD-10-CM | POA: Diagnosis not present

## 2018-03-26 DIAGNOSIS — M62830 Muscle spasm of back: Secondary | ICD-10-CM | POA: Diagnosis not present

## 2018-03-26 DIAGNOSIS — M9901 Segmental and somatic dysfunction of cervical region: Secondary | ICD-10-CM | POA: Diagnosis not present

## 2018-03-26 DIAGNOSIS — M9902 Segmental and somatic dysfunction of thoracic region: Secondary | ICD-10-CM | POA: Diagnosis not present

## 2018-04-17 DIAGNOSIS — M545 Low back pain: Secondary | ICD-10-CM | POA: Diagnosis not present

## 2018-05-03 ENCOUNTER — Telehealth: Payer: Self-pay

## 2018-05-03 MED ORDER — OMEPRAZOLE 40 MG PO CPDR: 40.0000 mg | DELAYED_RELEASE_CAPSULE | Freq: Every day | ORAL | 6 refills | Status: DC

## 2018-05-03 NOTE — Telephone Encounter (Signed)
Esomeprazole no longer covered by patient's insurance.  Sent Omeprazole to see if this is covered.

## 2018-06-05 DIAGNOSIS — M9902 Segmental and somatic dysfunction of thoracic region: Secondary | ICD-10-CM | POA: Diagnosis not present

## 2018-06-05 DIAGNOSIS — M62830 Muscle spasm of back: Secondary | ICD-10-CM | POA: Diagnosis not present

## 2018-06-05 DIAGNOSIS — M542 Cervicalgia: Secondary | ICD-10-CM | POA: Diagnosis not present

## 2018-06-05 DIAGNOSIS — M9901 Segmental and somatic dysfunction of cervical region: Secondary | ICD-10-CM | POA: Diagnosis not present

## 2018-07-12 DIAGNOSIS — M542 Cervicalgia: Secondary | ICD-10-CM | POA: Diagnosis not present

## 2018-07-12 DIAGNOSIS — M9901 Segmental and somatic dysfunction of cervical region: Secondary | ICD-10-CM | POA: Diagnosis not present

## 2018-07-12 DIAGNOSIS — M9902 Segmental and somatic dysfunction of thoracic region: Secondary | ICD-10-CM | POA: Diagnosis not present

## 2018-07-12 DIAGNOSIS — M62830 Muscle spasm of back: Secondary | ICD-10-CM | POA: Diagnosis not present

## 2018-09-05 DIAGNOSIS — M9902 Segmental and somatic dysfunction of thoracic region: Secondary | ICD-10-CM | POA: Diagnosis not present

## 2018-09-05 DIAGNOSIS — M542 Cervicalgia: Secondary | ICD-10-CM | POA: Diagnosis not present

## 2018-09-05 DIAGNOSIS — M9901 Segmental and somatic dysfunction of cervical region: Secondary | ICD-10-CM | POA: Diagnosis not present

## 2018-09-05 DIAGNOSIS — M62830 Muscle spasm of back: Secondary | ICD-10-CM | POA: Diagnosis not present

## 2018-09-27 DIAGNOSIS — M9903 Segmental and somatic dysfunction of lumbar region: Secondary | ICD-10-CM | POA: Diagnosis not present

## 2018-09-27 DIAGNOSIS — M545 Low back pain: Secondary | ICD-10-CM | POA: Diagnosis not present

## 2018-09-27 DIAGNOSIS — M9905 Segmental and somatic dysfunction of pelvic region: Secondary | ICD-10-CM | POA: Diagnosis not present

## 2018-09-27 DIAGNOSIS — M62830 Muscle spasm of back: Secondary | ICD-10-CM | POA: Diagnosis not present

## 2018-10-23 DIAGNOSIS — M9901 Segmental and somatic dysfunction of cervical region: Secondary | ICD-10-CM | POA: Diagnosis not present

## 2018-10-23 DIAGNOSIS — M542 Cervicalgia: Secondary | ICD-10-CM | POA: Diagnosis not present

## 2018-10-23 DIAGNOSIS — M62830 Muscle spasm of back: Secondary | ICD-10-CM | POA: Diagnosis not present

## 2018-10-23 DIAGNOSIS — M9902 Segmental and somatic dysfunction of thoracic region: Secondary | ICD-10-CM | POA: Diagnosis not present

## 2018-11-20 DIAGNOSIS — M62830 Muscle spasm of back: Secondary | ICD-10-CM | POA: Diagnosis not present

## 2018-11-20 DIAGNOSIS — M545 Low back pain: Secondary | ICD-10-CM | POA: Diagnosis not present

## 2018-11-20 DIAGNOSIS — M9905 Segmental and somatic dysfunction of pelvic region: Secondary | ICD-10-CM | POA: Diagnosis not present

## 2018-11-20 DIAGNOSIS — M9903 Segmental and somatic dysfunction of lumbar region: Secondary | ICD-10-CM | POA: Diagnosis not present

## 2018-11-21 ENCOUNTER — Other Ambulatory Visit: Payer: Self-pay | Admitting: Internal Medicine

## 2018-12-26 DIAGNOSIS — M9903 Segmental and somatic dysfunction of lumbar region: Secondary | ICD-10-CM | POA: Diagnosis not present

## 2018-12-26 DIAGNOSIS — M9905 Segmental and somatic dysfunction of pelvic region: Secondary | ICD-10-CM | POA: Diagnosis not present

## 2018-12-26 DIAGNOSIS — M62830 Muscle spasm of back: Secondary | ICD-10-CM | POA: Diagnosis not present

## 2018-12-26 DIAGNOSIS — M545 Low back pain: Secondary | ICD-10-CM | POA: Diagnosis not present

## 2019-03-04 DIAGNOSIS — N5201 Erectile dysfunction due to arterial insufficiency: Secondary | ICD-10-CM | POA: Diagnosis not present

## 2019-03-21 DIAGNOSIS — L255 Unspecified contact dermatitis due to plants, except food: Secondary | ICD-10-CM | POA: Diagnosis not present

## 2019-04-16 DIAGNOSIS — Z20828 Contact with and (suspected) exposure to other viral communicable diseases: Secondary | ICD-10-CM | POA: Diagnosis not present

## 2019-07-04 DIAGNOSIS — M62830 Muscle spasm of back: Secondary | ICD-10-CM | POA: Diagnosis not present

## 2019-07-04 DIAGNOSIS — M9901 Segmental and somatic dysfunction of cervical region: Secondary | ICD-10-CM | POA: Diagnosis not present

## 2019-07-04 DIAGNOSIS — M9902 Segmental and somatic dysfunction of thoracic region: Secondary | ICD-10-CM | POA: Diagnosis not present

## 2019-07-04 DIAGNOSIS — M542 Cervicalgia: Secondary | ICD-10-CM | POA: Diagnosis not present

## 2019-10-01 DIAGNOSIS — M542 Cervicalgia: Secondary | ICD-10-CM | POA: Diagnosis not present

## 2019-10-01 DIAGNOSIS — M9901 Segmental and somatic dysfunction of cervical region: Secondary | ICD-10-CM | POA: Diagnosis not present

## 2019-10-01 DIAGNOSIS — M62830 Muscle spasm of back: Secondary | ICD-10-CM | POA: Diagnosis not present

## 2019-10-01 DIAGNOSIS — M9902 Segmental and somatic dysfunction of thoracic region: Secondary | ICD-10-CM | POA: Diagnosis not present

## 2019-10-17 DIAGNOSIS — M62830 Muscle spasm of back: Secondary | ICD-10-CM | POA: Diagnosis not present

## 2019-10-17 DIAGNOSIS — M9901 Segmental and somatic dysfunction of cervical region: Secondary | ICD-10-CM | POA: Diagnosis not present

## 2019-10-17 DIAGNOSIS — M9902 Segmental and somatic dysfunction of thoracic region: Secondary | ICD-10-CM | POA: Diagnosis not present

## 2019-10-17 DIAGNOSIS — M542 Cervicalgia: Secondary | ICD-10-CM | POA: Diagnosis not present

## 2019-11-18 ENCOUNTER — Telehealth: Payer: Self-pay | Admitting: Internal Medicine

## 2019-11-18 MED ORDER — ESOMEPRAZOLE MAGNESIUM 40 MG PO CPDR: DELAYED_RELEASE_CAPSULE | ORAL | 0 refills | Status: DC

## 2019-11-18 NOTE — Telephone Encounter (Signed)
Refilled Nexium 

## 2020-01-15 ENCOUNTER — Encounter: Payer: Self-pay | Admitting: Internal Medicine

## 2020-01-15 ENCOUNTER — Ambulatory Visit (INDEPENDENT_AMBULATORY_CARE_PROVIDER_SITE_OTHER): Payer: BC Managed Care – PPO | Admitting: Internal Medicine

## 2020-01-15 VITALS — BP 122/80 | HR 80 | Ht 68.25 in | Wt 184.1 lb

## 2020-01-15 DIAGNOSIS — K219 Gastro-esophageal reflux disease without esophagitis: Secondary | ICD-10-CM | POA: Diagnosis not present

## 2020-01-15 DIAGNOSIS — Z1211 Encounter for screening for malignant neoplasm of colon: Secondary | ICD-10-CM

## 2020-01-15 MED ORDER — ESOMEPRAZOLE MAGNESIUM 40 MG PO CPDR
DELAYED_RELEASE_CAPSULE | ORAL | 3 refills | Status: DC
Start: 2020-01-15 — End: 2021-02-01

## 2020-01-15 NOTE — Progress Notes (Signed)
HISTORY OF PRESENT ILLNESS:  Jared Deleon is a 49 y.o. male with chronic GERD without alarm features who has been followed in the office for the same.  He has required regular PPI therapy to control symptoms.  He presents today for follow-up regarding management of his chronic GERD.  He was seen 2 years ago regarding the same.  He continues on Nexium 40 mg daily.  He rarely has breakthrough symptoms with dietary indiscretion for which he takes antacids.  No dysphagia.  He mentions that his PCP thought it might be advisable to come off of his PPI therapy (presumably due to concerns over long-term use).  Patient reports that when he tries this, symptoms are intolerable.  To this end, he does have questions regarding potential long-term medication side effects and safety issues.  His GI review of systems is otherwise negative.  No lower GI complaints.  No interval family history of colon cancer.  He has not had colon cancer screening.  He has completed his Covid vaccination series  REVIEW OF SYSTEMS:  All non-GI ROS negative unless otherwise stated in HPI.  Past Medical History:  Diagnosis Date  . GERD (gastroesophageal reflux disease)     Past Surgical History:  Procedure Laterality Date  . SHOULDER SURGERY    . TONSILLECTOMY      Social History MICKLE CAMPTON  reports that he has never smoked. He has never used smokeless tobacco. He reports current alcohol use. He reports that he does not use drugs.  family history includes Prostate cancer in his paternal grandfather.  Allergies  Allergen Reactions  . Penicillins        PHYSICAL EXAMINATION: Vital signs: BP 122/80 (BP Location: Left Arm, Patient Position: Sitting, Cuff Size: Normal)   Pulse 80   Ht 5' 8.25" (1.734 m) Comment: height measured without shoes  Wt 184 lb 2 oz (83.5 kg)   BMI 27.79 kg/m   Constitutional: generally well-appearing, no acute distress Psychiatric: alert and oriented x3, cooperative Eyes: extraocular  movements intact, anicteric, conjunctiva pink Mouth: oral pharynx moist, no lesions Neck: supple no lymphadenopathy Cardiovascular: heart regular rate and rhythm, no murmur Lungs: clear to auscultation bilaterally Abdomen: soft, nontender, nondistended, no obvious ascites, no peritoneal signs, normal bowel sounds, no organomegaly Rectal: Omitted Extremities: no clubbing, cyanosis, or lower extremity edema bilaterally Skin: no lesions on visible extremities Neuro: No focal deficits.  Cranial nerves intact  ASSESSMENT:  1.  Chronic GERD without alarm features requiring PPI to control symptoms.  He continues on Nexium 2.  Long-term Nexium use 3.  Colon cancer screening.  Baseline risk   PLAN:  1.  Reflux precautions 2.  Refill Nexium 40 mg daily.  Medication risks reviewed.  Current knowledge base regarding long-term use and potential safety issues reviewed. 3.  Colon cancer screening.  Revised guidelines to begin screening at 45 reviewed.  He understands, but wishes to defer at this time 4.  Routine office follow-up 2 years.  Contact the office in the interim for any questions or problems

## 2020-01-15 NOTE — Patient Instructions (Signed)
We have sent the following medications to your pharmacy for you to pick up at your convenience:  Nexium  Please follow up in 2 years  

## 2020-01-28 DIAGNOSIS — D3122 Benign neoplasm of left retina: Secondary | ICD-10-CM | POA: Diagnosis not present

## 2020-02-25 ENCOUNTER — Other Ambulatory Visit: Payer: Self-pay | Admitting: Urology

## 2020-03-10 DIAGNOSIS — M9902 Segmental and somatic dysfunction of thoracic region: Secondary | ICD-10-CM | POA: Diagnosis not present

## 2020-03-10 DIAGNOSIS — M62830 Muscle spasm of back: Secondary | ICD-10-CM | POA: Diagnosis not present

## 2020-03-10 DIAGNOSIS — M542 Cervicalgia: Secondary | ICD-10-CM | POA: Diagnosis not present

## 2020-03-10 DIAGNOSIS — M9901 Segmental and somatic dysfunction of cervical region: Secondary | ICD-10-CM | POA: Diagnosis not present

## 2020-04-06 DIAGNOSIS — M24811 Other specific joint derangements of right shoulder, not elsewhere classified: Secondary | ICD-10-CM | POA: Diagnosis not present

## 2020-05-04 DIAGNOSIS — M24811 Other specific joint derangements of right shoulder, not elsewhere classified: Secondary | ICD-10-CM | POA: Diagnosis not present

## 2020-05-11 DIAGNOSIS — M9903 Segmental and somatic dysfunction of lumbar region: Secondary | ICD-10-CM | POA: Diagnosis not present

## 2020-05-11 DIAGNOSIS — M9905 Segmental and somatic dysfunction of pelvic region: Secondary | ICD-10-CM | POA: Diagnosis not present

## 2020-05-11 DIAGNOSIS — M62830 Muscle spasm of back: Secondary | ICD-10-CM | POA: Diagnosis not present

## 2020-05-11 DIAGNOSIS — M5451 Vertebrogenic low back pain: Secondary | ICD-10-CM | POA: Diagnosis not present

## 2020-05-27 DIAGNOSIS — M5451 Vertebrogenic low back pain: Secondary | ICD-10-CM | POA: Diagnosis not present

## 2020-05-27 DIAGNOSIS — M62830 Muscle spasm of back: Secondary | ICD-10-CM | POA: Diagnosis not present

## 2020-05-27 DIAGNOSIS — M9905 Segmental and somatic dysfunction of pelvic region: Secondary | ICD-10-CM | POA: Diagnosis not present

## 2020-05-27 DIAGNOSIS — M9903 Segmental and somatic dysfunction of lumbar region: Secondary | ICD-10-CM | POA: Diagnosis not present

## 2020-06-01 DIAGNOSIS — U071 COVID-19: Secondary | ICD-10-CM | POA: Diagnosis not present

## 2020-06-10 DIAGNOSIS — M9903 Segmental and somatic dysfunction of lumbar region: Secondary | ICD-10-CM | POA: Diagnosis not present

## 2020-06-10 DIAGNOSIS — M62830 Muscle spasm of back: Secondary | ICD-10-CM | POA: Diagnosis not present

## 2020-06-10 DIAGNOSIS — M9905 Segmental and somatic dysfunction of pelvic region: Secondary | ICD-10-CM | POA: Diagnosis not present

## 2020-06-10 DIAGNOSIS — M5451 Vertebrogenic low back pain: Secondary | ICD-10-CM | POA: Diagnosis not present

## 2020-06-24 DIAGNOSIS — M62830 Muscle spasm of back: Secondary | ICD-10-CM | POA: Diagnosis not present

## 2020-06-24 DIAGNOSIS — M9903 Segmental and somatic dysfunction of lumbar region: Secondary | ICD-10-CM | POA: Diagnosis not present

## 2020-06-24 DIAGNOSIS — M9905 Segmental and somatic dysfunction of pelvic region: Secondary | ICD-10-CM | POA: Diagnosis not present

## 2020-06-24 DIAGNOSIS — M5451 Vertebrogenic low back pain: Secondary | ICD-10-CM | POA: Diagnosis not present

## 2020-07-29 DIAGNOSIS — M5451 Vertebrogenic low back pain: Secondary | ICD-10-CM | POA: Diagnosis not present

## 2020-07-29 DIAGNOSIS — M9905 Segmental and somatic dysfunction of pelvic region: Secondary | ICD-10-CM | POA: Diagnosis not present

## 2020-07-29 DIAGNOSIS — M62830 Muscle spasm of back: Secondary | ICD-10-CM | POA: Diagnosis not present

## 2020-07-29 DIAGNOSIS — M9903 Segmental and somatic dysfunction of lumbar region: Secondary | ICD-10-CM | POA: Diagnosis not present

## 2020-08-19 DIAGNOSIS — M5451 Vertebrogenic low back pain: Secondary | ICD-10-CM | POA: Diagnosis not present

## 2020-08-19 DIAGNOSIS — M62830 Muscle spasm of back: Secondary | ICD-10-CM | POA: Diagnosis not present

## 2020-08-19 DIAGNOSIS — M9903 Segmental and somatic dysfunction of lumbar region: Secondary | ICD-10-CM | POA: Diagnosis not present

## 2020-08-19 DIAGNOSIS — M9905 Segmental and somatic dysfunction of pelvic region: Secondary | ICD-10-CM | POA: Diagnosis not present

## 2020-09-09 DIAGNOSIS — M9903 Segmental and somatic dysfunction of lumbar region: Secondary | ICD-10-CM | POA: Diagnosis not present

## 2020-09-09 DIAGNOSIS — M62830 Muscle spasm of back: Secondary | ICD-10-CM | POA: Diagnosis not present

## 2020-09-09 DIAGNOSIS — M9905 Segmental and somatic dysfunction of pelvic region: Secondary | ICD-10-CM | POA: Diagnosis not present

## 2020-09-09 DIAGNOSIS — M5451 Vertebrogenic low back pain: Secondary | ICD-10-CM | POA: Diagnosis not present

## 2020-09-30 DIAGNOSIS — M9905 Segmental and somatic dysfunction of pelvic region: Secondary | ICD-10-CM | POA: Diagnosis not present

## 2020-09-30 DIAGNOSIS — M9903 Segmental and somatic dysfunction of lumbar region: Secondary | ICD-10-CM | POA: Diagnosis not present

## 2020-09-30 DIAGNOSIS — M62830 Muscle spasm of back: Secondary | ICD-10-CM | POA: Diagnosis not present

## 2020-09-30 DIAGNOSIS — M5451 Vertebrogenic low back pain: Secondary | ICD-10-CM | POA: Diagnosis not present

## 2020-10-21 DIAGNOSIS — M9905 Segmental and somatic dysfunction of pelvic region: Secondary | ICD-10-CM | POA: Diagnosis not present

## 2020-10-21 DIAGNOSIS — M9903 Segmental and somatic dysfunction of lumbar region: Secondary | ICD-10-CM | POA: Diagnosis not present

## 2020-10-21 DIAGNOSIS — M62830 Muscle spasm of back: Secondary | ICD-10-CM | POA: Diagnosis not present

## 2020-10-21 DIAGNOSIS — M5451 Vertebrogenic low back pain: Secondary | ICD-10-CM | POA: Diagnosis not present

## 2020-11-12 DIAGNOSIS — M9903 Segmental and somatic dysfunction of lumbar region: Secondary | ICD-10-CM | POA: Diagnosis not present

## 2020-11-12 DIAGNOSIS — M5451 Vertebrogenic low back pain: Secondary | ICD-10-CM | POA: Diagnosis not present

## 2020-11-12 DIAGNOSIS — M9905 Segmental and somatic dysfunction of pelvic region: Secondary | ICD-10-CM | POA: Diagnosis not present

## 2020-11-12 DIAGNOSIS — M9902 Segmental and somatic dysfunction of thoracic region: Secondary | ICD-10-CM | POA: Diagnosis not present

## 2020-12-02 DIAGNOSIS — M5451 Vertebrogenic low back pain: Secondary | ICD-10-CM | POA: Diagnosis not present

## 2020-12-02 DIAGNOSIS — M9905 Segmental and somatic dysfunction of pelvic region: Secondary | ICD-10-CM | POA: Diagnosis not present

## 2020-12-02 DIAGNOSIS — M62830 Muscle spasm of back: Secondary | ICD-10-CM | POA: Diagnosis not present

## 2020-12-02 DIAGNOSIS — M9903 Segmental and somatic dysfunction of lumbar region: Secondary | ICD-10-CM | POA: Diagnosis not present

## 2020-12-18 DIAGNOSIS — M24811 Other specific joint derangements of right shoulder, not elsewhere classified: Secondary | ICD-10-CM | POA: Diagnosis not present

## 2020-12-23 DIAGNOSIS — M62830 Muscle spasm of back: Secondary | ICD-10-CM | POA: Diagnosis not present

## 2020-12-23 DIAGNOSIS — M9905 Segmental and somatic dysfunction of pelvic region: Secondary | ICD-10-CM | POA: Diagnosis not present

## 2020-12-23 DIAGNOSIS — M5451 Vertebrogenic low back pain: Secondary | ICD-10-CM | POA: Diagnosis not present

## 2020-12-23 DIAGNOSIS — M9903 Segmental and somatic dysfunction of lumbar region: Secondary | ICD-10-CM | POA: Diagnosis not present

## 2021-01-13 DIAGNOSIS — M9905 Segmental and somatic dysfunction of pelvic region: Secondary | ICD-10-CM | POA: Diagnosis not present

## 2021-01-13 DIAGNOSIS — M9903 Segmental and somatic dysfunction of lumbar region: Secondary | ICD-10-CM | POA: Diagnosis not present

## 2021-01-13 DIAGNOSIS — M5451 Vertebrogenic low back pain: Secondary | ICD-10-CM | POA: Diagnosis not present

## 2021-01-13 DIAGNOSIS — M62830 Muscle spasm of back: Secondary | ICD-10-CM | POA: Diagnosis not present

## 2021-01-27 DIAGNOSIS — M9905 Segmental and somatic dysfunction of pelvic region: Secondary | ICD-10-CM | POA: Diagnosis not present

## 2021-01-27 DIAGNOSIS — M62830 Muscle spasm of back: Secondary | ICD-10-CM | POA: Diagnosis not present

## 2021-01-27 DIAGNOSIS — M9903 Segmental and somatic dysfunction of lumbar region: Secondary | ICD-10-CM | POA: Diagnosis not present

## 2021-01-27 DIAGNOSIS — M5451 Vertebrogenic low back pain: Secondary | ICD-10-CM | POA: Diagnosis not present

## 2021-01-31 ENCOUNTER — Other Ambulatory Visit: Payer: Self-pay | Admitting: Internal Medicine

## 2021-03-10 DIAGNOSIS — M5451 Vertebrogenic low back pain: Secondary | ICD-10-CM | POA: Diagnosis not present

## 2021-03-10 DIAGNOSIS — M9903 Segmental and somatic dysfunction of lumbar region: Secondary | ICD-10-CM | POA: Diagnosis not present

## 2021-03-10 DIAGNOSIS — M9905 Segmental and somatic dysfunction of pelvic region: Secondary | ICD-10-CM | POA: Diagnosis not present

## 2021-03-10 DIAGNOSIS — M62830 Muscle spasm of back: Secondary | ICD-10-CM | POA: Diagnosis not present

## 2021-03-29 DIAGNOSIS — M9905 Segmental and somatic dysfunction of pelvic region: Secondary | ICD-10-CM | POA: Diagnosis not present

## 2021-03-29 DIAGNOSIS — M5451 Vertebrogenic low back pain: Secondary | ICD-10-CM | POA: Diagnosis not present

## 2021-03-29 DIAGNOSIS — M9903 Segmental and somatic dysfunction of lumbar region: Secondary | ICD-10-CM | POA: Diagnosis not present

## 2021-03-29 DIAGNOSIS — M62830 Muscle spasm of back: Secondary | ICD-10-CM | POA: Diagnosis not present

## 2021-04-28 ENCOUNTER — Other Ambulatory Visit: Payer: Self-pay | Admitting: Internal Medicine

## 2021-04-28 DIAGNOSIS — M9903 Segmental and somatic dysfunction of lumbar region: Secondary | ICD-10-CM | POA: Diagnosis not present

## 2021-04-28 DIAGNOSIS — M62830 Muscle spasm of back: Secondary | ICD-10-CM | POA: Diagnosis not present

## 2021-04-28 DIAGNOSIS — M9905 Segmental and somatic dysfunction of pelvic region: Secondary | ICD-10-CM | POA: Diagnosis not present

## 2021-04-28 DIAGNOSIS — M5451 Vertebrogenic low back pain: Secondary | ICD-10-CM | POA: Diagnosis not present

## 2021-07-30 ENCOUNTER — Telehealth: Payer: Self-pay | Admitting: Internal Medicine

## 2021-07-30 NOTE — Telephone Encounter (Signed)
Patient requesting refill on Omeprazole 

## 2021-08-02 ENCOUNTER — Other Ambulatory Visit: Payer: Self-pay | Admitting: Internal Medicine

## 2021-08-02 MED ORDER — ESOMEPRAZOLE MAGNESIUM 40 MG PO CPDR: DELAYED_RELEASE_CAPSULE | ORAL | 1 refills | Status: DC

## 2021-08-02 NOTE — Telephone Encounter (Signed)
Refilled Omeprazole 

## 2021-09-03 ENCOUNTER — Ambulatory Visit: Payer: BC Managed Care – PPO | Admitting: Internal Medicine

## 2021-09-03 ENCOUNTER — Encounter: Payer: Self-pay | Admitting: Internal Medicine

## 2021-09-03 VITALS — BP 130/84 | HR 85 | Ht 68.25 in | Wt 184.0 lb

## 2021-09-03 DIAGNOSIS — R197 Diarrhea, unspecified: Secondary | ICD-10-CM | POA: Diagnosis not present

## 2021-09-03 DIAGNOSIS — K219 Gastro-esophageal reflux disease without esophagitis: Secondary | ICD-10-CM | POA: Diagnosis not present

## 2021-09-03 DIAGNOSIS — R109 Unspecified abdominal pain: Secondary | ICD-10-CM

## 2021-09-03 DIAGNOSIS — Z1211 Encounter for screening for malignant neoplasm of colon: Secondary | ICD-10-CM | POA: Diagnosis not present

## 2021-09-03 DIAGNOSIS — R194 Change in bowel habit: Secondary | ICD-10-CM | POA: Diagnosis not present

## 2021-09-03 MED ORDER — SUTAB 1479-225-188 MG PO TABS: 1.0000 | ORAL_TABLET | Freq: Once | ORAL | 0 refills | Status: AC

## 2021-09-03 NOTE — Progress Notes (Signed)
HISTORY OF PRESENT ILLNESS: ? ?Jared Deleon is a 51 y.o. male with chronic GERD without alarm features who has been followed in this office for the same.  He requires PPI on a regular basis to control symptoms.  He presents today for follow-up regarding management of his chronic GERD.  He also presents with change in bowel habits with diarrhea.  He was last seen January 15, 2020.  See that dictation for details. ? ?He continues on Nexium 40 mg daily.  This continues to control his reflux symptoms well.  Off medication symptoms return.  No dysphagia.  He has not had once look endoscopy to rule out Barrett's. ? ?Next, he reports after dining out in March, he developed problems with abdominal cramping with urgency and loose stools.  This seems to be exacerbated by meals and particular items such as salads.  This has occurred intermittently since the onset.  He will have issues for several days.  He may go 1 week and feel fairly well.  He also describes early satiety with bloating and belching.  New symptoms for him.  No weight loss.  No fevers.  No bleeding.  Symptoms are rarely nocturnal, but rather mostly in the morning and after lunch.  He has taken probiotic for a few weeks.  Possibly helpful.  He has not had colon cancer screening, but is interested. ? ?REVIEW OF SYSTEMS: ? ?All non-GI ROS negative unless otherwise stated in the HPI except for back pain ? ?Past Medical History:  ?Diagnosis Date  ? GERD (gastroesophageal reflux disease)   ? ? ?Past Surgical History:  ?Procedure Laterality Date  ? SHOULDER SURGERY    ? TONSILLECTOMY    ? ? ?Social History ?Jared Deleon  reports that he has never smoked. He has never used smokeless tobacco. He reports current alcohol use. He reports that he does not use drugs. ? ?family history includes Prostate cancer in his paternal grandfather. ? ?Allergies  ?Allergen Reactions  ? Penicillins   ? ? ?  ? ?PHYSICAL EXAMINATION: ?Vital signs: BP 130/84   Pulse 85   Ht 5' 8.25"  (1.734 m)   Wt 184 lb (83.5 kg)   SpO2 98%   BMI 27.77 kg/m?   ?Constitutional: generally well-appearing, no acute distress ?Psychiatric: alert and oriented x3, cooperative ?Eyes: extraocular movements intact, anicteric, conjunctiva pink ?Mouth: oral pharynx moist, no lesions ?Neck: supple no lymphadenopathy ?Cardiovascular: heart regular rate and rhythm, no murmur ?Lungs: clear to auscultation bilaterally ?Abdomen: soft, nontender, nondistended, no obvious ascites, no peritoneal signs, normal bowel sounds, no organomegaly ?Rectal: Omitted ?Extremities: no clubbing, cyanosis, or lower extremity edema bilaterally ?Skin: no lesions on visible extremities ?Neuro: No focal deficits.  Cranial nerves intact ? ?ASSESSMENT: ? ?1.  Chronic GERD.  Symptoms require PPI for control.  Has not had prior ones look endoscopy to rule out Barrett's. ?2.  Recent problems with postprandial abdominal discomfort with urgency and loose stools on an intermittent basis.  Sounds like postinfectious IBS.  Ongoing. ?3.  Colon cancer screening.  Average risk.  Due.  Motivated.  No contraindication. ? ? ?PLAN: ? ?1.  Reflux precautions ?2.  Continue Nexium 40 mg daily.  Prescription refilled.  Medication risk reviewed. ?3.  Recommend IBgard 1 before meals. ?4.  Screening colonoscopy.The nature of the procedure, as well as the risks, benefits, and alternatives were carefully and thoroughly reviewed with the patient. Ample time for discussion and questions allowed. The patient understood, was satisfied, and agreed to proceed. ?  5.  Screening upper endoscopy to rule out Barrett's.The nature of the procedure, as well as the risks, benefits, and alternatives were carefully and thoroughly reviewed with the patient. Ample time for discussion and questions allowed. The patient understood, was satisfied, and agreed to proceed.  ?A total time of 40 minutes was spent preparing to see the patient, reviewing records, obtaining comprehensive history,  performing medically appropriate physical examination, ordering medications, recommending OTC agents, ordering endoscopy procedures, and documenting clinical information in the health record ? ? ? ? ?  ?

## 2021-09-03 NOTE — Patient Instructions (Signed)
If you are age 49 or older, your body mass index should be between 23-30. Your Body mass index is 27.77 kg/m?Marland Kitchen If this is out of the aforementioned range listed, please consider follow up with your Primary Care Provider. ? ?If you are age 25 or younger, your body mass index should be between 19-25. Your Body mass index is 27.77 kg/m?Marland Kitchen If this is out of the aformentioned range listed, please consider follow up with your Primary Care Provider.  ? ?________________________________________________________ ? ?The [facility] providers would like to encourage you to use [facility] to communicate with providers for non-urgent requests or questions.  Due to long hold times on the telephone, sending your provider a message by [facility] may be a faster and more efficient way to get a response.  Please allow 48 business hours for a response.  Please remember that this is for non-urgent requests.  ?_______________________________________________________ ? ?You have been scheduled for an endoscopy and colonoscopy. Please follow the written instructions given to you at your visit today. ?Please pick up your prep supplies at the pharmacy within the next 1-3 days. ?If you use inhalers (even only as needed), please bring them with you on the day of your procedure. ? ? ?Take IB Gard over the counter - 1 before meals ?

## 2021-10-12 ENCOUNTER — Encounter: Payer: Self-pay | Admitting: Internal Medicine

## 2021-10-19 ENCOUNTER — Ambulatory Visit (AMBULATORY_SURGERY_CENTER): Payer: BC Managed Care – PPO | Admitting: Internal Medicine

## 2021-10-19 ENCOUNTER — Encounter: Payer: Self-pay | Admitting: Internal Medicine

## 2021-10-19 VITALS — BP 112/67 | HR 67 | Temp 98.4°F | Resp 11 | Ht 68.0 in | Wt 184.0 lb

## 2021-10-19 DIAGNOSIS — Z1211 Encounter for screening for malignant neoplasm of colon: Secondary | ICD-10-CM

## 2021-10-19 DIAGNOSIS — K2970 Gastritis, unspecified, without bleeding: Secondary | ICD-10-CM

## 2021-10-19 DIAGNOSIS — K219 Gastro-esophageal reflux disease without esophagitis: Secondary | ICD-10-CM | POA: Diagnosis not present

## 2021-10-19 DIAGNOSIS — R194 Change in bowel habit: Secondary | ICD-10-CM

## 2021-10-19 DIAGNOSIS — R197 Diarrhea, unspecified: Secondary | ICD-10-CM | POA: Diagnosis not present

## 2021-10-19 DIAGNOSIS — K2961 Other gastritis with bleeding: Secondary | ICD-10-CM

## 2021-10-19 DIAGNOSIS — K253 Acute gastric ulcer without hemorrhage or perforation: Secondary | ICD-10-CM

## 2021-10-19 DIAGNOSIS — D123 Benign neoplasm of transverse colon: Secondary | ICD-10-CM

## 2021-10-19 DIAGNOSIS — R109 Unspecified abdominal pain: Secondary | ICD-10-CM

## 2021-10-19 DIAGNOSIS — K259 Gastric ulcer, unspecified as acute or chronic, without hemorrhage or perforation: Secondary | ICD-10-CM | POA: Diagnosis not present

## 2021-10-19 MED ORDER — SODIUM CHLORIDE 0.9 % IV SOLN: 500.0000 mL | Freq: Once | INTRAVENOUS | Status: DC

## 2021-10-19 NOTE — Patient Instructions (Signed)
Please read handouts provided. Continue present medications. Await pathology results. Try Citrucel 2 tablespoons daily. Resume previous diet. Repeat colonoscopy in 7 years for screening.   YOU HAD AN ENDOSCOPIC PROCEDURE TODAY AT [facility]:   Refer to the procedure report that was given to you for any specific questions about what was found during the examination.  If the procedure report does not answer your questions, please call your gastroenterologist to clarify.  If you requested that your care partner not be given the details of your procedure findings, then the procedure report has been included in a sealed envelope for you to review at your convenience later.  YOU SHOULD EXPECT: Some feelings of bloating in the abdomen. Passage of more gas than usual.  Walking can help get rid of the air that was put into your GI tract during the procedure and reduce the bloating. If you had a lower endoscopy (such as a colonoscopy or flexible sigmoidoscopy) you may notice spotting of blood in your stool or on the toilet paper. If you underwent a bowel prep for your procedure, you may not have a normal bowel movement for a few days.  Please Note:  You might notice some irritation and congestion in your nose or some drainage.  This is from the oxygen used during your procedure.  There is no need for concern and it should clear up in a day or so.  SYMPTOMS TO REPORT IMMEDIATELY:  Following lower endoscopy (colonoscopy or flexible sigmoidoscopy):  Excessive amounts of blood in the stool  Significant tenderness or worsening of abdominal pains  Swelling of the abdomen that is new, acute  Fever of 100F or higher  Following upper endoscopy (EGD)  Vomiting of blood or coffee ground material  New chest pain or pain under the shoulder blades  Painful or persistently difficult swallowing  New shortness of breath  Fever of 100F or higher  Black, tarry-looking stools  For urgent or  emergent issues, a gastroenterologist can be reached at any hour by calling [phone]. Do not use MyChart messaging for urgent concerns.    DIET:  We do recommend a small meal at first, but then you may proceed to your regular diet.  Drink plenty of fluids but you should avoid alcoholic beverages for 24 hours.  ACTIVITY:  You should plan to take it easy for the rest of today and you should NOT DRIVE or use heavy machinery until tomorrow (because of the sedation medicines used during the test).    FOLLOW UP: Our staff will call the number listed on your records 24-72 hours following your procedure to check on you and address any questions or concerns that you may have regarding the information given to you following your procedure. If we do not reach you, we will leave a message.  We will attempt to reach you two times.  During this call, we will ask if you have developed any symptoms of COVID 19. If you develop any symptoms (ie: fever, flu-like symptoms, shortness of breath, cough etc.) before then, please call [phone].  If you test positive for Covid 19 in the 2 weeks post procedure, please call and report this information to [place].    If any biopsies were taken you will be contacted by phone or by letter within the next 1-3 weeks.  Please call us at [phone] if you have not heard about the biopsies in 3 weeks.    SIGNATURES/CONFIDENTIALITY: You and/or your care partner  have signed paperwork which will be entered into your electronic medical record.  These signatures attest to the fact that that the information above on your After Visit Summary has been reviewed and is understood.  Full responsibility of the confidentiality of this discharge information lies with you and/or your care-partner.

## 2021-10-19 NOTE — Op Note (Signed)
[facility] Patient Name: Jared Deleon Procedure Date: 10/19/2021 9:16 AM MRN: [id number] Endoscopist: Docia Chuck. [name] , MD Age: 51 Referring MD:  Date of Birth: 1970/07/31 Gender: Male Account #: [id number] Procedure:                Upper GI endoscopy with biopsies Indications:              Esophageal reflux, Screening for Barrett's esophagus Medicines:                Monitored Anesthesia Care Procedure:                Pre-Anesthesia Assessment:                           - Prior to the procedure, a History and Physical                            was performed, and patient medications and                            allergies were reviewed. The patient's tolerance of                            previous anesthesia was also reviewed. The risks                            and benefits of the procedure and the sedation                            options and risks were discussed with the patient.                            All questions were answered, and informed consent                            was obtained. Prior Anticoagulants: The patient has                            taken no previous anticoagulant or antiplatelet                            agents. ASA Grade Assessment: I - A normal, healthy                            patient. After reviewing the risks and benefits,                            the patient was deemed in satisfactory condition to                            undergo the procedure.                           After obtaining informed consent, the endoscope was  passed under direct vision. Throughout the                            procedure, the patient's blood pressure, pulse, and                            oxygen saturations were monitored continuously. The                            Endoscope was introduced through the mouth, and                            advanced to the second part of duodenum. The upper                            GI  endoscopy was accomplished without difficulty.                            The patient tolerated the procedure well. Scope In: Scope Out: Findings:                 The esophagus was normal. No Barrett's.                           The stomach revealed multiple linear gastric                            erosions (superficial) on the posterior body.                            Biopsies were taken with a cold forceps for                            histology.                           The examined duodenum was normal.                           The cardia and gastric fundus were normal on                            retroflexion. Complications:            No immediate complications. Estimated Blood Loss:     Estimated blood loss: none. Impression:               - Normal esophagus. No Barrett's.                           - Erosive gastritis. Biopsied.                           - Normal examined duodenum. Recommendation:           - Patient has a contact number available for  emergencies. The signs and symptoms of potential                            delayed complications were discussed with the                            patient. Return to normal activities tomorrow.                            Written discharge instructions were provided to the                            patient.                           - Resume previous diet.                           - Continue present medications.                           - Await pathology results. Docia Chuck. [name], MD 10/19/2021 10:01:46 AM This report has been signed electronically.

## 2021-10-19 NOTE — Progress Notes (Signed)
HISTORY OF PRESENT ILLNESS:   Jared Deleon is a 51 y.o. male with chronic GERD without alarm features who has been followed in this office for the same.  He requires PPI on a regular basis to control symptoms.  He presents today for follow-up regarding management of his chronic GERD.  He also presents with change in bowel habits with diarrhea.  He was last seen January 15, 2020.  See that dictation for details.  He continues on Nexium 40 mg daily.  This continues to control his reflux symptoms well.  Off medication symptoms return.  No dysphagia.  He has not had once look endoscopy to rule out Barrett's.  Next, he reports after dining out in March, he developed problems with abdominal cramping with urgency and loose stools.  This seems to be exacerbated by meals and particular items such as salads.  This has occurred intermittently since the onset.  He will have issues for several days.  He may go 1 week and feel fairly well.  He also describes early satiety with bloating and belching.  New symptoms for him.  No weight loss.  No fevers.  No bleeding.  Symptoms are rarely nocturnal, but rather mostly in the morning and after lunch.  He has taken probiotic for a few weeks.  Possibly helpful.  He has not had colon cancer screening, but is interested.   REVIEW OF SYSTEMS:   All non-GI ROS negative unless otherwise stated in the HPI except for back pain       Past Medical History:  Diagnosis Date   GERD (gastroesophageal reflux disease)             Past Surgical History:  Procedure Laterality Date   SHOULDER SURGERY       TONSILLECTOMY          Social History Jared Deleon  reports that he has never smoked. He has never used smokeless tobacco. He reports current alcohol use. He reports that he does not use drugs.   family history includes Prostate cancer in his paternal grandfather.       Allergies  Allergen Reactions   Penicillins            PHYSICAL EXAMINATION: Vital signs: BP  130/84   Pulse 85   Ht 5' 8.25" (1.734 m)   Wt 184 lb (83.5 kg)   SpO2 98%   BMI 27.77 kg/m   Constitutional: generally well-appearing, no acute distress Psychiatric: alert and oriented x3, cooperative Eyes: extraocular movements intact, anicteric, conjunctiva pink Mouth: oral pharynx moist, no lesions Neck: supple no lymphadenopathy Cardiovascular: heart regular rate and rhythm, no murmur Lungs: clear to auscultation bilaterally Abdomen: soft, nontender, nondistended, no obvious ascites, no peritoneal signs, normal bowel sounds, no organomegaly Rectal: Omitted Extremities: no clubbing, cyanosis, or lower extremity edema bilaterally Skin: no lesions on visible extremities Neuro: No focal deficits.  Cranial nerves intact   ASSESSMENT:   1.  Chronic GERD.  Symptoms require PPI for control.  Has not had prior ones look endoscopy to rule out Barrett's. 2.  Recent problems with postprandial abdominal discomfort with urgency and loose stools on an intermittent basis.  Sounds like postinfectious IBS.  Ongoing. 3.  Colon cancer screening.  Average risk.  Due.  Motivated.  No contraindication.     PLAN:   1.  Reflux precautions 2.  Continue Nexium 40 mg daily.  Prescription refilled.  Medication risk reviewed. 3.  Recommend IBgard 1 before meals. 4.  Screening  colonoscopy.The nature of the procedure, as well as the risks, benefits, and alternatives were carefully and thoroughly reviewed with the patient. Ample time for discussion and questions allowed. The patient understood, was satisfied, and agreed to proceed. 5.  Screening upper endoscopy to rule out Barrett's.The nature of the procedure, as well as the risks, benefits, and alternatives were carefully and thoroughly reviewed with the patient. Ample time for discussion and questions allowed. The patient understood, was satisfied, and agreed to proceed.   Patient presents today for colonoscopy and upper endoscopy.  Complete H&P during  recent office visit as outlined above.  No interval change in clinical status or physical exam.

## 2021-10-19 NOTE — Progress Notes (Signed)
Vss nad trans to pacu °

## 2021-10-19 NOTE — Progress Notes (Signed)
Called to room to assist during endoscopic procedure.  Patient ID and intended procedure confirmed with present staff. Received instructions for my participation in the procedure from the performing physician.  

## 2021-10-19 NOTE — Op Note (Signed)
[facility] Patient Name: Jared Deleon Procedure Date: 10/19/2021 9:16 AM MRN: [id number] Endoscopist: Docia Chuck. [name] , MD Age: 51 Referring MD:  Date of Birth: [birth year] Gender: Male Account #: [id number] Procedure:                Colonoscopy with cold snare polypectomy x 1; with                            biopsies Indications:              Screening for colorectal malignant neoplasm. Also,                            with recent change in bowel habits as manifested by                            postprandial urgency with loose stools. This has                            persisted. Medicines:                Monitored Anesthesia Care Procedure:                Pre-Anesthesia Assessment:                           - Prior to the procedure, a History and Physical                            was performed, and patient medications and                            allergies were reviewed. The patient's tolerance of                            previous anesthesia was also reviewed. The risks                            and benefits of the procedure and the sedation                            options and risks were discussed with the patient.                            All questions were answered, and informed consent                            was obtained. Prior Anticoagulants: The patient has                            taken no previous anticoagulant or antiplatelet                            agents. ASA Grade Assessment: I - A normal, healthy  patient. After reviewing the risks and benefits,                            the patient was deemed in satisfactory condition to                            undergo the procedure.                           After obtaining informed consent, the colonoscope                            was passed under direct vision. Throughout the                            procedure, the patient's blood pressure, pulse, and                             oxygen saturations were monitored continuously. The                            CF [id number] #[id number] was introduced through the anus                            and advanced to the the cecum, identified by                            appendiceal orifice and ileocecal valve. The                            terminal ileum, ileocecal valve, appendiceal                            orifice, and rectum were photographed. The quality                            of the bowel preparation was excellent. The                            colonoscopy was performed without difficulty. The                            patient tolerated the procedure well. The bowel                            preparation used was SUPREP via split dose                            instruction. Scope In: 9:27:00 AM Scope Out: 9:39:56 AM Scope Withdrawal Time: 0 hours 9 minutes 45 seconds  Total Procedure Duration: 0 hours 12 minutes 56 seconds  Findings:                 The terminal ileum appeared normal.  A 5 mm polyp was found in the transverse colon. The                            polyp was removed with a cold snare. Resection and                            retrieval were complete.                           The exam was otherwise without abnormality on                            direct and retroflexion views. Random colon                            biopsies were taken to rule out microscopic colitis. Complications:            No immediate complications. Estimated blood loss:                            None. Estimated Blood Loss:     Estimated blood loss: none. Impression:               - The examined portion of the ileum was normal.                           - One 5 mm polyp in the transverse colon, removed                            with a cold snare. Resected and retrieved.                           - The examination was otherwise normal on direct                            and retroflexion  views. Recommendation:           - Repeat colonoscopy in 7 years for surveillance.                           - Patient has a contact number available for                            emergencies. The signs and symptoms of potential                            delayed complications were discussed with the                            patient. Return to normal activities tomorrow.                            Written discharge instructions were provided to the  patient.                           - Resume previous diet.                           - Continue present medications.                           - Await pathology results.                           - Try Citrucel 2 tablespoons daily. This in hopes                            of improving bowel consistency and issues with                            urgency Docia Chuck. [name], MD 10/19/2021 9:48:46 AM This report has been signed electronically.

## 2021-10-19 NOTE — Progress Notes (Signed)
Pt's states no medical or surgical changes since previsit or office visit. 

## 2021-10-20 ENCOUNTER — Telehealth: Payer: Self-pay

## 2021-10-20 NOTE — Telephone Encounter (Signed)
  Follow up Call-     10/19/2021    8:15 AM  Call back number  Post procedure Call Back phone  # [phone]  Permission to leave phone message Yes     Patient questions:  Do you have a fever, pain , or abdominal swelling? No. Pain Score  0 *  Have you tolerated food without any problems? Yes.    Have you been able to return to your normal activities? Yes.    Do you have any questions about your discharge instructions: Diet   No. Medications  No. Follow up visit  No.  Do you have questions or concerns about your Care? No.  Actions: * If pain score is 4 or above: No action needed, pain <4.

## 2021-10-27 ENCOUNTER — Encounter: Payer: Self-pay | Admitting: Internal Medicine

## 2022-01-25 ENCOUNTER — Other Ambulatory Visit: Payer: Self-pay | Admitting: Internal Medicine

## 2022-10-24 ENCOUNTER — Other Ambulatory Visit: Payer: Self-pay | Admitting: Internal Medicine

## 2023-05-30 ENCOUNTER — Other Ambulatory Visit: Payer: Self-pay | Admitting: Urology

## 2023-07-20 ENCOUNTER — Other Ambulatory Visit: Payer: Self-pay | Admitting: Internal Medicine

## 2023-08-03 NOTE — ED Provider Notes (Signed)
 [facility] Emergency Department Physician Note  ED Course - HPI and Medical Decision Making  Chief Complaint: Dizziness   History is obtained from Patient HPI: This is a 53 y.o. male who presents to the emergency department with episodic/triggered dizziness.  History of Present Illness The patient presents to the emergency room for evaluation of dizziness.  He experienced an episode of dizziness on Monday evening while engaged in a phone conversation, which was accompanied by a sensation of his head swimming. The dizziness persisted as he attempted to walk around, but seemed to subside upon returning home. On Tuesday morning, the dizziness was still present but had slightly improved. By Wednesday morning, he felt well; however, the dizziness recurred in the afternoon and has been persistent throughout today. He reports no sensation of impending syncope but occasionally experiences tunnel vision. He describes the sensation as akin to being intoxicated. He also reports headaches, with the most severe episode occurring last night at the base of his neck. Today, the headache has been localized to various areas, including above his eye. He reports no recent trauma, falls, or car accidents. He underwent a chiropractic adjustment on Wednesday. He wears glasses and is uncertain if they may be contributing to his symptoms. His appetite and hydration status are normal, although he experienced bloating on Monday. He reports no chest pain, shortness of breath, leg pain or swelling, or history of blood clots. He is not currently on testosterone  therapy. He reports no numbness or tingling on one side of his body. He has no history of vertigo and reports no ear fullness or popping. He is currently experiencing a mild sensation of his head swimming. He notes that his symptoms are exacerbated by rapid head movements, such as when driving and checking his blind spot. He recalls a flight to  [facility] on Sunday, but did not experience any symptoms until Monday.  Supplemental Information He takes Nexium .  MEDICATIONS Nexium      MDM: Differential diagnosis includes, but not limited to: IIK:AEEC, labyrinthitis, Meniere's, Brainstem infarction, CVA, migraine, MS, Carotid sinus hypersensitivity, otitis media, post-concussive syndrome, mediations, hyperventilation, VIII nerve tumor, acoustic neuroma, cerebellar tumor, neurofibromatosis, MI, ACS, HTN emergency, UTI, infection, AKI, dehydration, orthostasis, electrolyte abnormalities Initial plan: Place patient on telemetry with continuous pulse oximeter reading and vital signs per emergency protocol, ECG to evaluate for high grade block, arrhythmia, ischemic changes, CBC to evaluate cell lines for anemia, thrombocytopenia, leukocytosis, leukopenia, CMP to evaluate electrolyte derangements, renal function, liver function, Cardiac indices to evaluate for potential myocardial ischemia or strain, and MRI brain to evaluate for posterior circulation CVA/insult or vestibular abnormality  Assessment: Patient presents with dizziness Cardiac telemetry ordered for continuous cardiac and hemodynamic monitoring; reviewed telemetry showing Normal sinus rhythm without arrhythmia Labs and imaging reviewed and interpreted by me: CBC unremarkable, CMP with Cr 1.34 (no previous for comparison), b12 WNL and troponin negative My interpretation of his ECG shows NSR, no ST elevation/depression, normal intervals, no high grade block, no STEMI  MRI brain with no acute findings  His symptoms appear to  fall into a triggered vestibular syndrome as change in position and head movement worsens dizziness. He is not having significant symptoms sitting here on my evaluation and HINTS exam was deferred. He was given fluids and decadron /meclizine as I suspect this does have some component of BPPV; consideration of mild orthostasis as well for which he was given fluids.    He was ambulated without ataxia, his neurologic exam  is reassuring (normal FTN/HTS, negative Rhomberg, no truncal ataxia).  He is feeling slightly improved and feels comfortable with going home, will provide meclizine for symptomatic management, he will f/u for recheck of Cr as his was mildly elevated here. If symptoms worsens, persistent, or become constant he will return.  On reevaluation, patient appears well. Afebrile, hemodynamically stable, sPO2 stable, and is stable for discharge home. Follow-up was recommended and return precautions discussed.   Abnormal Labs Reviewed  COMPREHENSIVE METABOLIC PANEL - Abnormal; Notable for the following components:      Result Value   Creatinine 1.34 (*)    All other components within normal limits  POC GLUCOSE (AH) - Abnormal; Notable for the following components:   Glucose, POC 101 (*)    All other components within normal limits   MRI Brain WO Contrast  Final Result by Gilmore Glendia Molt, MD (03/27 2306)  CLINICAL DATA:  Dizziness, persistent/recurrent, cardiac or vascular  cause suspected, Neuro deficit, acute, stroke suspected;    EXAM:  MRI HEAD WITHOUT CONTRAST    TECHNIQUE:  Multiplanar, multiecho pulse sequences of the brain and surrounding  structures were obtained without intravenous contrast.    COMPARISON:  None Available.    FINDINGS:  Brain: No acute infarction, hemorrhage, hydrocephalus, extra-axial  collection or mass lesion.    Vascular: Normal flow voids.    Skull and upper cervical spine: Normal marrow signal.    Sinuses/Orbits: Negative.    Other: No mastoid effusions.    IMPRESSION:  No evidence of acute intracranial abnormality.      Electronically Signed    By: Gilmore GORMAN Molt M.D.    On: 08/03/2023 23:06           Medical Decision Making Problems Addressed: Elevated serum creatinine: complicated acute illness or injury Vestibular disequilibrium, unspecified laterality: complicated  acute illness or injury  Amount and/or Complexity of Data Reviewed Labs: ordered. Radiology: ordered. ECG/medicine tests: ordered.  Risk Prescription drug management.     Factors Impacting ED Encounter Complexity: Decision regarding hospitalization or escalation of hospital level care and Discussion regarding prescription drug management  Review of Systems  A pertinent review of systems was obtained and was negative except as noted in the HPI and MDM. Physical Exam   ED Triage Vitals [08/03/23 1915]  Temp 97.7 F (36.5 C)  Heart Rate 83  Resp 18  BP (!) 149/92  MAP (mmHg) 111  SpO2 98 %  O2 Device None (Room air)  O2 Flow Rate (L/min)   Weight 84.6 kg (186 lb 9.6 oz)   Physical Exam  Physical Exam Constitutional Nursing notes reviewed Vital signs reviewed  HEENT No obvious trauma Supple without meningismus Pupils cross midline No scleral icterus or injection  Respiratory Effort normal CTAB No respiratory distress  CV Normal rate No pitting edema  Abdomen Soft Non-tender Non-distended No peritonitis  MSK Atraumatic No obvious deformity ROM appropriate  Skin Warm Dry  Neuro Mental status: A/Ox3 CN II-XII intact.  Sensation intact to sharp/dull differentiation in all extremities. Motor: Normal tone and bulk. No abnormal movements appreciated. No pronator drift. Strength tested and 5/5 in bilateral elbow flexion/extension, shoulder abduction, knee flexion/extension, ankle dorsiflexion/plantarflexion.  Patient ambulates with a steady gait. Coordination: Finger to nose and heel to shin testing intact bilaterally. Reflexes: Patellar reflexes WNL and symmetric bilaterally  Psychiatric Mood and affect normal    Other pertinent exam findings per MDM-ED Course Procedures  Procedures Impression  Patient's presentation is most consistent with acute presentation  with potential threat to life or bodily function. 1. Vestibular disequilibrium, unspecified laterality    2. Elevated serum creatinine    ED Disposition     ED Disposition  Discharge   Condition  Stable   Comment  --        Follow-up [facility] [phone] Schedule an appointment as soon as possible for a visit

## 2023-10-11 ENCOUNTER — Telehealth: Payer: Self-pay | Admitting: Internal Medicine

## 2023-10-11 MED ORDER — ESOMEPRAZOLE MAGNESIUM 40 MG PO CPDR: DELAYED_RELEASE_CAPSULE | ORAL | 1 refills | Status: DC

## 2023-10-11 NOTE — Telephone Encounter (Signed)
 Nexium refilled

## 2023-10-11 NOTE — Telephone Encounter (Signed)
 Patient called and stated that he has no more refills on his Esomeprazole  and was wondering if he could get more refills before he is seen on August the 22 nd for his yearly follow up with Dr. Elvin Hammer. Please advise.

## 2023-12-29 ENCOUNTER — Encounter: Payer: Self-pay | Admitting: Internal Medicine

## 2023-12-29 ENCOUNTER — Ambulatory Visit (INDEPENDENT_AMBULATORY_CARE_PROVIDER_SITE_OTHER): Admitting: Internal Medicine

## 2023-12-29 VITALS — BP 120/70 | HR 72 | Ht 68.0 in | Wt 182.4 lb

## 2023-12-29 DIAGNOSIS — R194 Change in bowel habit: Secondary | ICD-10-CM | POA: Diagnosis not present

## 2023-12-29 DIAGNOSIS — K219 Gastro-esophageal reflux disease without esophagitis: Secondary | ICD-10-CM | POA: Diagnosis not present

## 2023-12-29 MED ORDER — ESOMEPRAZOLE MAGNESIUM 40 MG PO CPDR: DELAYED_RELEASE_CAPSULE | ORAL | 3 refills | Status: AC

## 2023-12-29 NOTE — Patient Instructions (Signed)
 We have sent the following medications to your pharmacy for you to pick up at your convenience:  Nexium   Take 2 tablespoons of Citrucel (over the counter) in 14 ounces of water or juice daily.  Please follow up in one year.  _______________________________________________________  If your blood pressure at your visit was 140/90 or greater, please contact your primary care physician to follow up on this.  _______________________________________________________  If you are age 51 or older, your body mass index should be between 23-30. Your Body mass index is 27.73 kg/m. If this is out of the aforementioned range listed, please consider follow up with your Primary Care Provider.  If you are age 88 or younger, your body mass index should be between 19-25. Your Body mass index is 27.73 kg/m. If this is out of the aformentioned range listed, please consider follow up with your Primary Care Provider.   ________________________________________________________  The [facility] providers would like to encourage you to use MYCHART to communicate with providers for non-urgent requests or questions.  Due to long hold times on the telephone, sending your provider a message by [facility] may be a faster and more efficient way to get a response.  Please allow 48 business hours for a response.  Please remember that this is for non-urgent requests.  _______________________________________________________  [facility] is using a team-based approach to care.  Your team is made up of your doctor and two to three APPS. Our APPS (Nurse Practitioners and Physician Assistants) work with your physician to ensure care continuity for you. They are fully qualified to address your health concerns and develop a treatment plan. They communicate directly with your gastroenterologist to care for you. Seeing the Advanced Practice Practitioners on your physician's team can help you by facilitating care more promptly,  often allowing for earlier appointments, access to diagnostic testing, procedures, and other specialty referrals.

## 2023-12-29 NOTE — Progress Notes (Signed)
 HISTORY OF PRESENT ILLNESS:  Jared Deleon is a 53 y.o. male who has been followed in this office for chronic GERD requiring PPI to control symptoms and irregular bowel habits--namely intermittent issues with loose stools.  He was last in this office September 03, 2001.  See education.  In June 2023 he underwent complete colonoscopy.  He was found to have a diminutive adenoma.  The ileum was normal.  Colon was otherwise normal.  Random colon biopsies were normal.  Follow-up colonoscopy in 7 years recommended.  Also in June 2023 he underwent upper endoscopy.  The esophagus was normal without Barrett's.  He was found to have gastric erosions.  Biopsies negative for H. pylori.  He presents today for follow-up.  In terms of reflux, his symptoms remain well-controlled on Nexium  40 mg daily.  Breakthrough symptoms are rare.  No dysphagia.  He request medication refill.  Continues with irregular bowel habits.  He will tend toward loose stools.  No significant urgency.  No nocturnal symptoms.  He will use IBgard which will calm things down with time.  No new features or alarm features  REVIEW OF SYSTEMS:  All non-GI ROS negative. Past Medical History:  Diagnosis Date   GERD (gastroesophageal reflux disease)     Past Surgical History:  Procedure Laterality Date   COLONOSCOPY     SHOULDER SURGERY     TONSILLECTOMY     UPPER GASTROINTESTINAL ENDOSCOPY      Social History Jared Deleon  reports that he has never smoked. He has never used smokeless tobacco. He reports current alcohol use of about 8.0 standard drinks of alcohol per week. He reports that he does not use drugs.  family history includes Prostate cancer in his paternal grandfather.  Allergies  Allergen Reactions   Penicillins        PHYSICAL EXAMINATION: Vital signs: BP 120/70 (BP Location: Left Arm, Patient Position: Sitting, Cuff Size: Normal)   Pulse 72   Ht 5' 8 (1.727 m)   Wt 182 lb 6 oz (82.7 kg)   BMI 27.73 kg/m   General: Well-developed, well-nourished, no acute distress HEENT: Sclerae are anicteric, conjunctiva pink. Oral mucosa intact Lungs: Clear Heart: Regular Abdomen: soft, nontender, nondistended, no obvious ascites, no peritoneal signs, normal bowel sounds. No organomegaly. Extremities: No edema Psychiatric: alert and oriented x3. Cooperative    ASSESSMENT:  1.  GERD.  Symptoms require and are controlled with Nexium  40 mg daily. 2.  EGD without Barrett's 3.  Irregular bowel habits as described 4.  Nonadvanced adenoma 2023.  Negative random colon biopsies   PLAN:  1.  Reflux precautions 2.  Continue Nexium  40 mg daily 3.  Refill medication.  Medication list reviewed 4.  Recommended Citrucel powder.  2 tablespoons daily in 14 ounces of water or juice.  Hopefully this will help regulate his bowels 5.  Surveillance colonoscopy around 2030 6.  Routine office follow-up 1 year.  Contact the office in the interim for any questions or problems
